# Patient Record
Sex: Male | Born: 1960 | Race: White | Hispanic: No | Smoking: Current every day smoker
Health system: Southern US, Community
[De-identification: ages and names within clinical notes are randomized; demographics above are authoritative.]

## PROBLEM LIST (undated history)

## (undated) DIAGNOSIS — F419 Anxiety disorder, unspecified: Secondary | ICD-10-CM

## (undated) DIAGNOSIS — I10 Essential (primary) hypertension: Secondary | ICD-10-CM

## (undated) DIAGNOSIS — G40909 Epilepsy, unspecified, not intractable, without status epilepticus: Secondary | ICD-10-CM

## (undated) DIAGNOSIS — I6529 Occlusion and stenosis of unspecified carotid artery: Secondary | ICD-10-CM

## (undated) DIAGNOSIS — F32A Depression, unspecified: Secondary | ICD-10-CM

## (undated) DIAGNOSIS — F1020 Alcohol dependence, uncomplicated: Secondary | ICD-10-CM

## (undated) DIAGNOSIS — D696 Thrombocytopenia, unspecified: Secondary | ICD-10-CM

## (undated) HISTORY — DX: Thrombocytopenia, unspecified: D69.6

## (undated) HISTORY — DX: Depression, unspecified: F32.A

## (undated) HISTORY — PX: FOOT FRACTURE SURGERY: SHX645

## (undated) HISTORY — DX: Anxiety disorder, unspecified: F41.9

## (undated) HISTORY — DX: Epilepsy, unspecified, not intractable, without status epilepticus: G40.909

## (undated) HISTORY — PX: HAND SURGERY: SHX662

## (undated) HISTORY — DX: Occlusion and stenosis of unspecified carotid artery: I65.29

---

## 2002-05-27 ENCOUNTER — Emergency Department (HOSPITAL_COMMUNITY): Admission: EM | Admit: 2002-05-27 | Discharge: 2002-05-27 | Payer: Self-pay | Admitting: Emergency Medicine

## 2004-08-15 ENCOUNTER — Emergency Department (HOSPITAL_COMMUNITY): Admission: EM | Admit: 2004-08-15 | Discharge: 2004-08-15 | Payer: Self-pay | Admitting: Emergency Medicine

## 2004-09-15 ENCOUNTER — Emergency Department (HOSPITAL_COMMUNITY): Admission: EM | Admit: 2004-09-15 | Discharge: 2004-09-15 | Payer: Self-pay

## 2004-12-06 ENCOUNTER — Emergency Department (HOSPITAL_COMMUNITY): Admission: EM | Admit: 2004-12-06 | Discharge: 2004-12-06 | Payer: Self-pay | Admitting: Emergency Medicine

## 2004-12-06 ENCOUNTER — Observation Stay (HOSPITAL_COMMUNITY): Admission: EM | Admit: 2004-12-06 | Discharge: 2004-12-07 | Payer: Self-pay | Admitting: Emergency Medicine

## 2004-12-13 ENCOUNTER — Ambulatory Visit: Payer: Self-pay | Admitting: Internal Medicine

## 2005-09-13 ENCOUNTER — Emergency Department (HOSPITAL_COMMUNITY): Admission: EM | Admit: 2005-09-13 | Discharge: 2005-09-13 | Payer: Self-pay | Admitting: Emergency Medicine

## 2007-01-18 ENCOUNTER — Inpatient Hospital Stay (HOSPITAL_COMMUNITY): Admission: EM | Admit: 2007-01-18 | Discharge: 2007-01-20 | Payer: Self-pay | Admitting: Emergency Medicine

## 2007-10-04 ENCOUNTER — Emergency Department (HOSPITAL_COMMUNITY): Admission: EM | Admit: 2007-10-04 | Discharge: 2007-10-04 | Payer: Self-pay | Admitting: Emergency Medicine

## 2007-10-07 ENCOUNTER — Ambulatory Visit: Payer: Self-pay | Admitting: Orthopedic Surgery

## 2007-11-02 ENCOUNTER — Ambulatory Visit: Payer: Self-pay | Admitting: Orthopedic Surgery

## 2007-11-02 DIAGNOSIS — S92009A Unspecified fracture of unspecified calcaneus, initial encounter for closed fracture: Secondary | ICD-10-CM

## 2007-11-04 ENCOUNTER — Emergency Department (HOSPITAL_COMMUNITY): Admission: EM | Admit: 2007-11-04 | Discharge: 2007-11-05 | Payer: Self-pay | Admitting: Emergency Medicine

## 2007-11-12 ENCOUNTER — Encounter: Payer: Self-pay | Admitting: Orthopedic Surgery

## 2007-11-17 ENCOUNTER — Encounter: Payer: Self-pay | Admitting: Orthopedic Surgery

## 2007-11-23 ENCOUNTER — Ambulatory Visit: Payer: Self-pay | Admitting: Orthopedic Surgery

## 2008-01-04 ENCOUNTER — Ambulatory Visit: Payer: Self-pay | Admitting: Orthopedic Surgery

## 2008-02-15 ENCOUNTER — Ambulatory Visit: Payer: Self-pay | Admitting: Orthopedic Surgery

## 2008-02-15 DIAGNOSIS — M7660 Achilles tendinitis, unspecified leg: Secondary | ICD-10-CM

## 2008-03-24 ENCOUNTER — Ambulatory Visit: Payer: Self-pay | Admitting: Orthopedic Surgery

## 2008-07-20 ENCOUNTER — Ambulatory Visit: Payer: Self-pay | Admitting: Orthopedic Surgery

## 2008-07-28 ENCOUNTER — Telehealth: Payer: Self-pay | Admitting: Orthopedic Surgery

## 2008-08-08 ENCOUNTER — Telehealth: Payer: Self-pay | Admitting: Orthopedic Surgery

## 2008-08-11 ENCOUNTER — Encounter: Payer: Self-pay | Admitting: Orthopedic Surgery

## 2009-06-02 IMAGING — CR DG FOOT COMPLETE 3+V*R*
3 series · 3 of 3 positions shown · non-contrast
Comparison: none available.

CLINICAL DATA: Broken right foot.  
 RIGHT FOOT - 3 VIEW:

[view not recorded (1 of 3)]
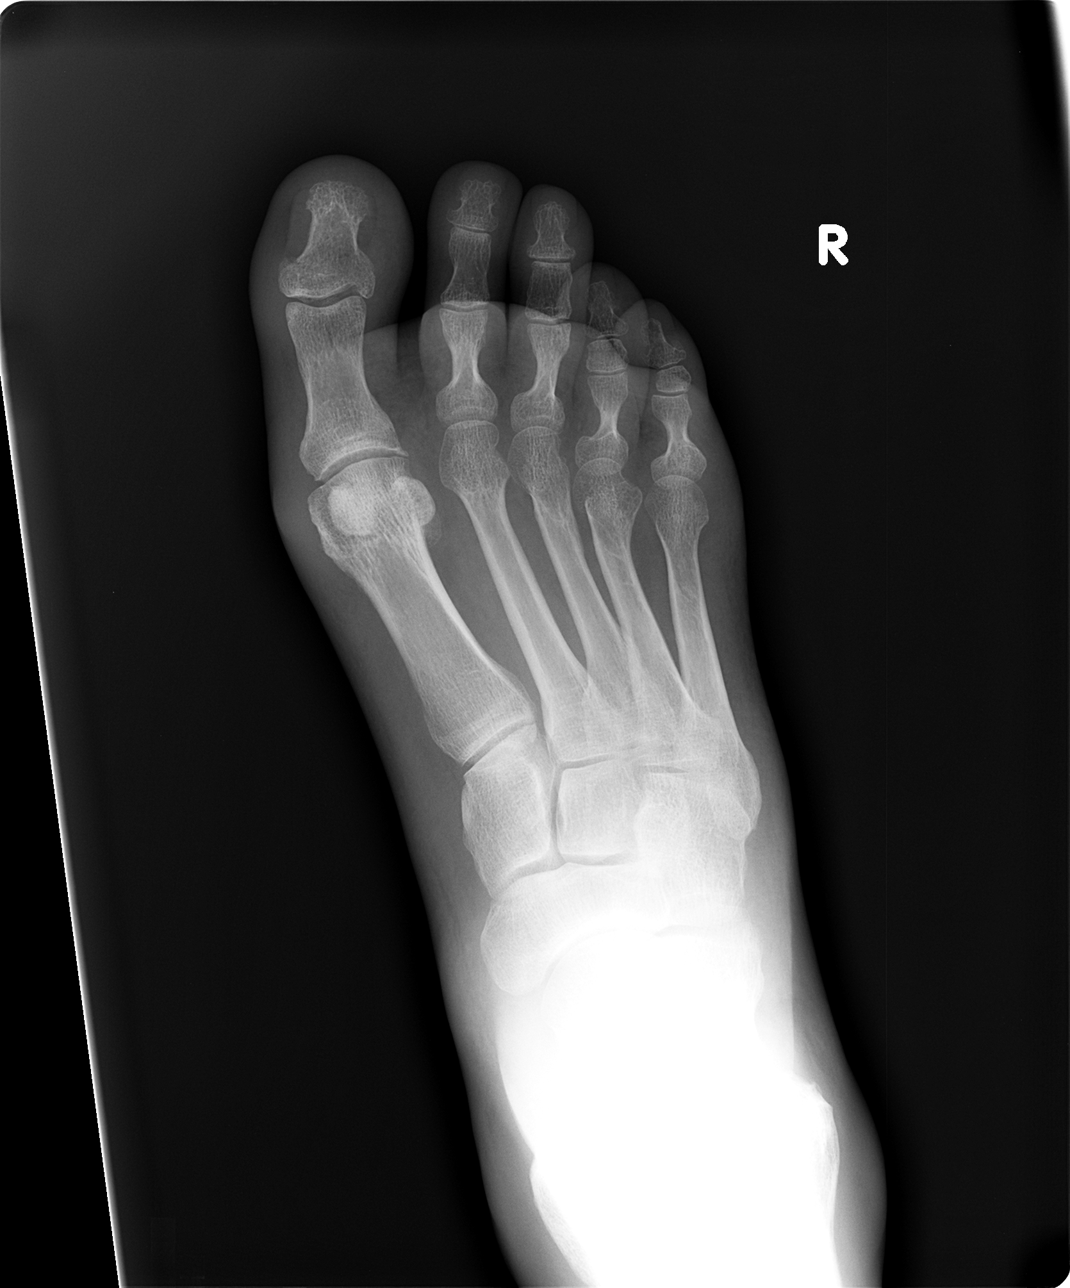

[view not recorded (2 of 3)]
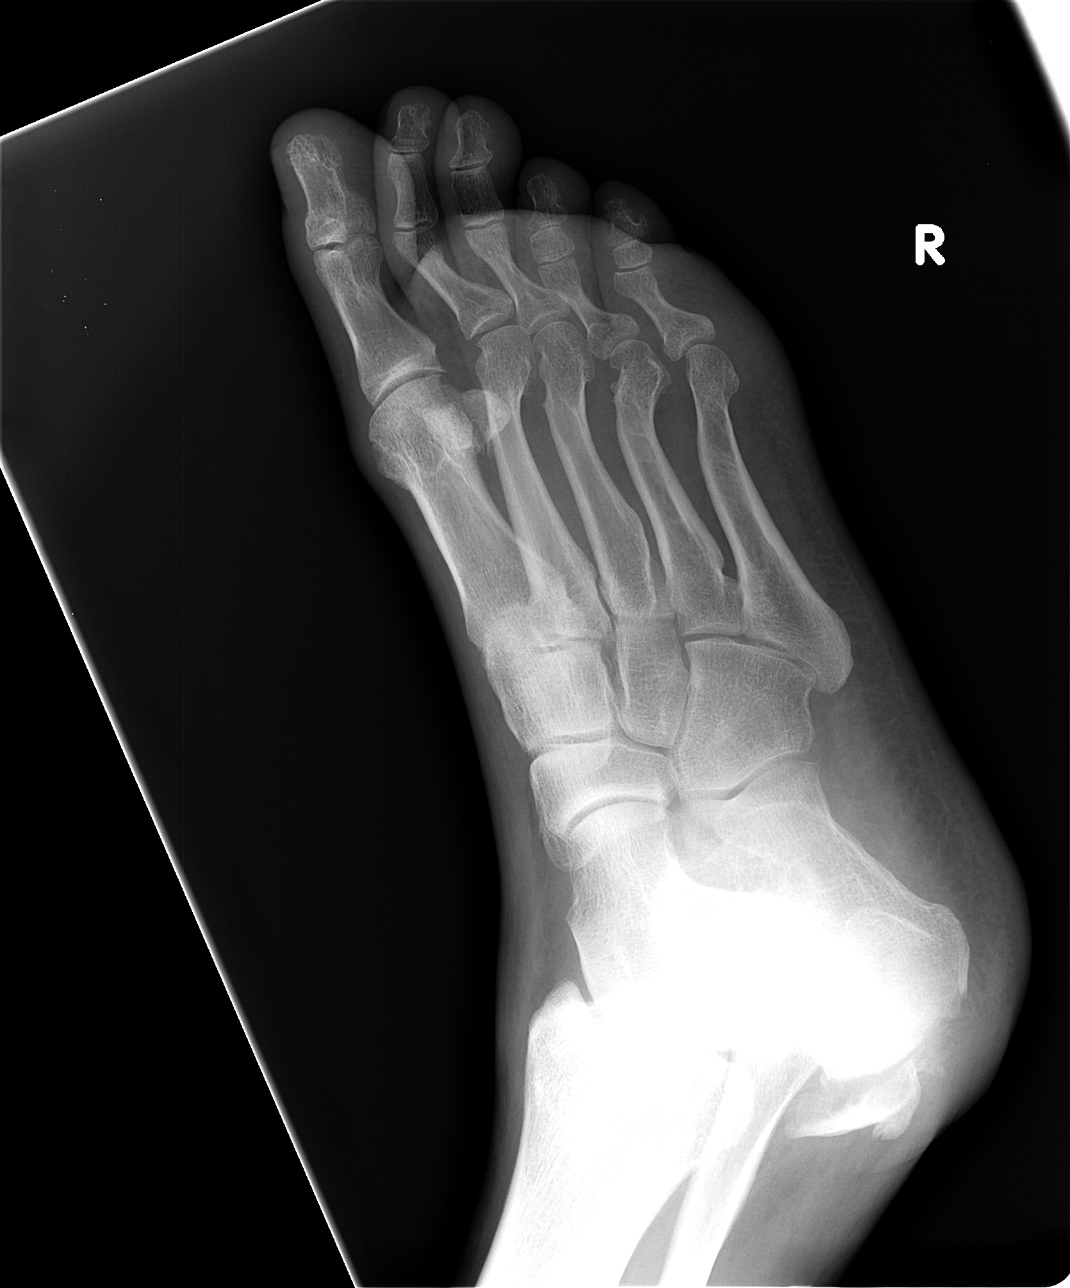

[view not recorded (3 of 3)]
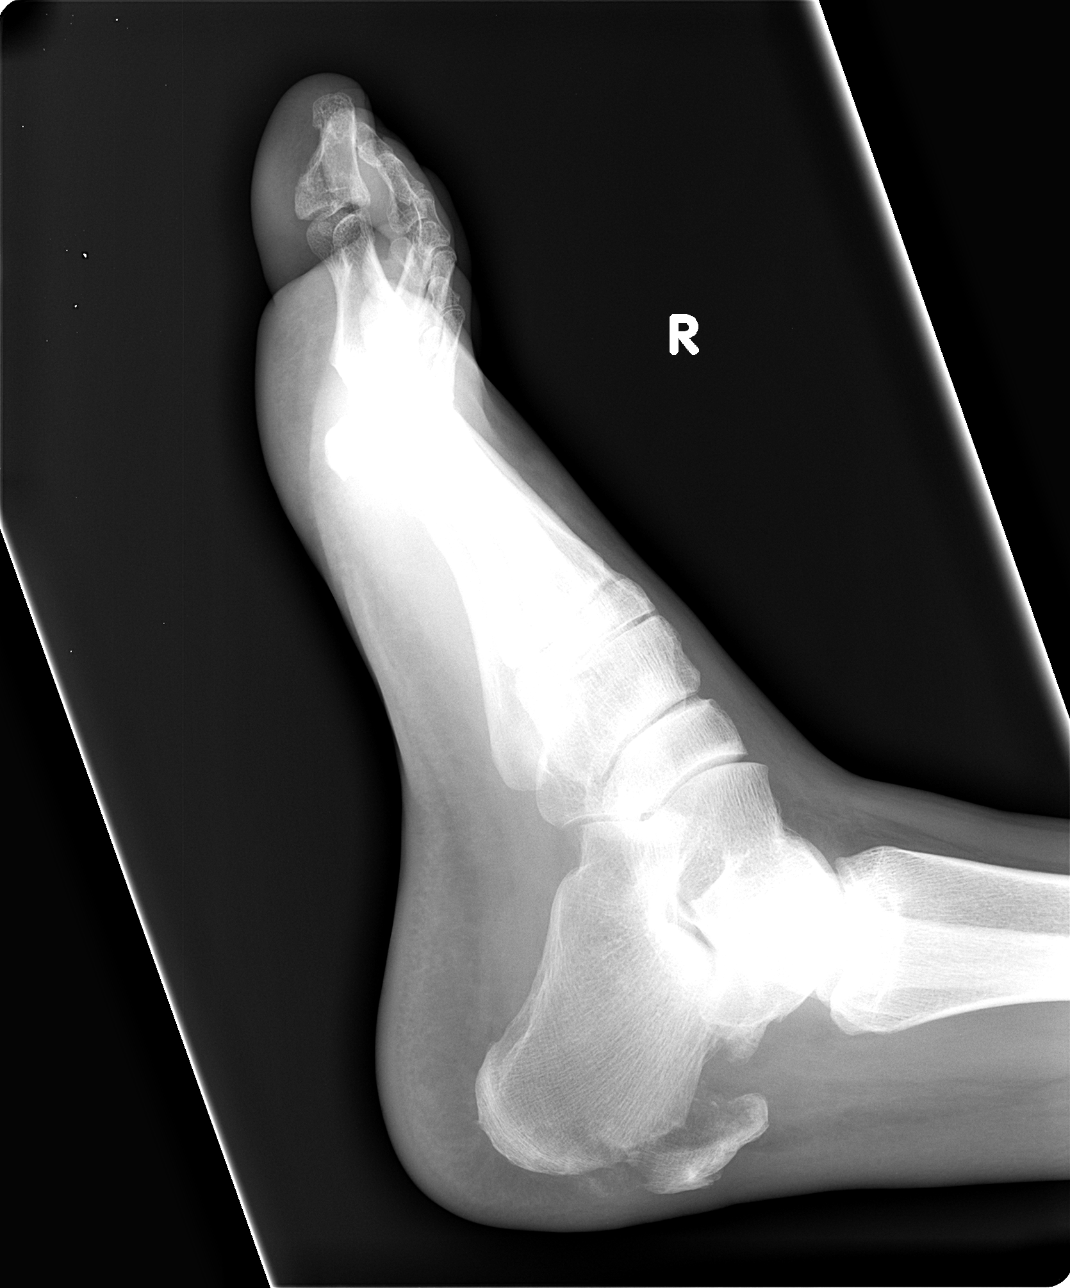

[3 of 3 positions shown; findings below may reference images not displayed]

FINDINGS: 1st MTP osteoarthritis is present, moderate.  Old deformity of the right 4th ray, at the head-neck junction of the metatarsal and proximal phalanx.   
 The calcaneus is abnormal, with a fracture of the calcaneal tuberosity, and proximal retraction.  Findings consistent with the so-called calcaneal insufficiency avulsion.
IMPRESSION: Avulsion fracture of calcaneal tuberosity, with 1.4 cm of proximal retraction.  
 RIGHT ANKLE - 3 VIEW:
FINDINGS: Again, the calcaneal tuberosity fracture is identified, with proximal retraction of 1.4 cm.  Diffuse soft tissue swelling is present around the ankle.  Diffuse edema in Kager?s fat pain.
IMPRESSION: Calcaneal tuberosity avulsion fracture.

## 2009-06-02 IMAGING — CR DG ANKLE COMPLETE 3+V*R*
3 series · 3 of 3 positions shown · non-contrast
Comparison: none available.

CLINICAL DATA: Broken right foot.  
 RIGHT FOOT - 3 VIEW:

[view not recorded (1 of 3)]
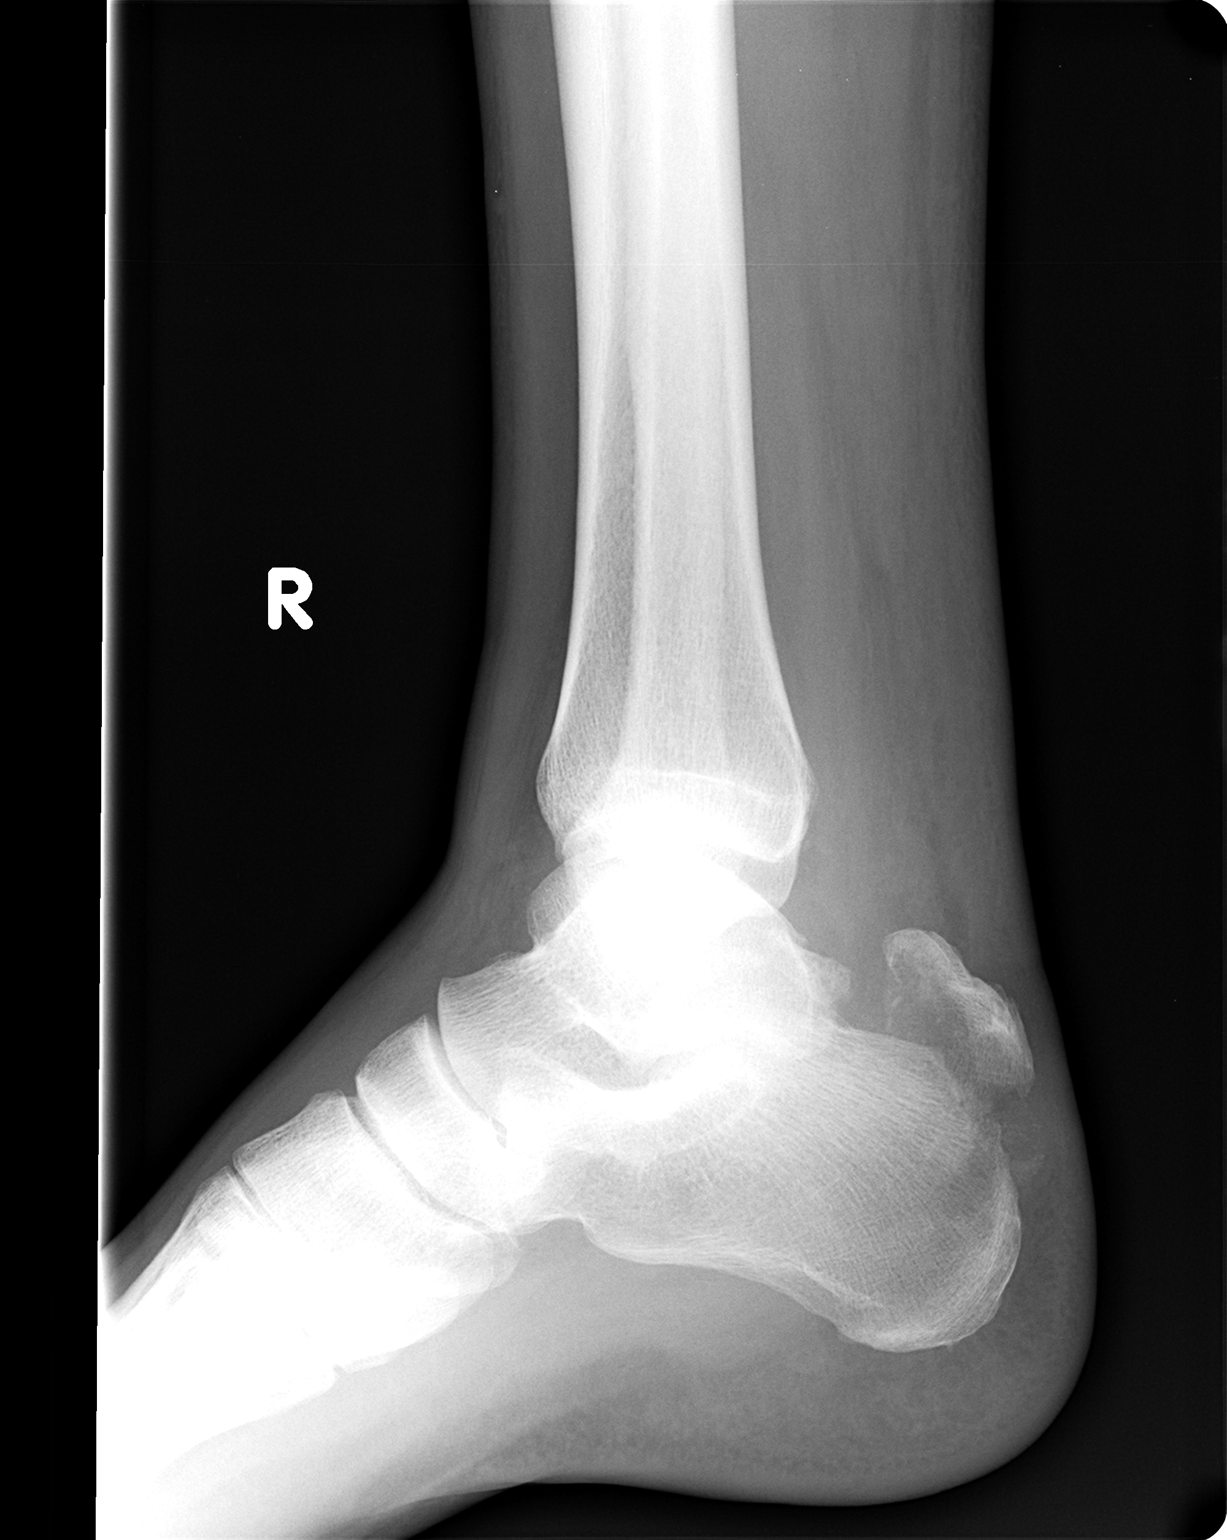

[view not recorded (2 of 3)]
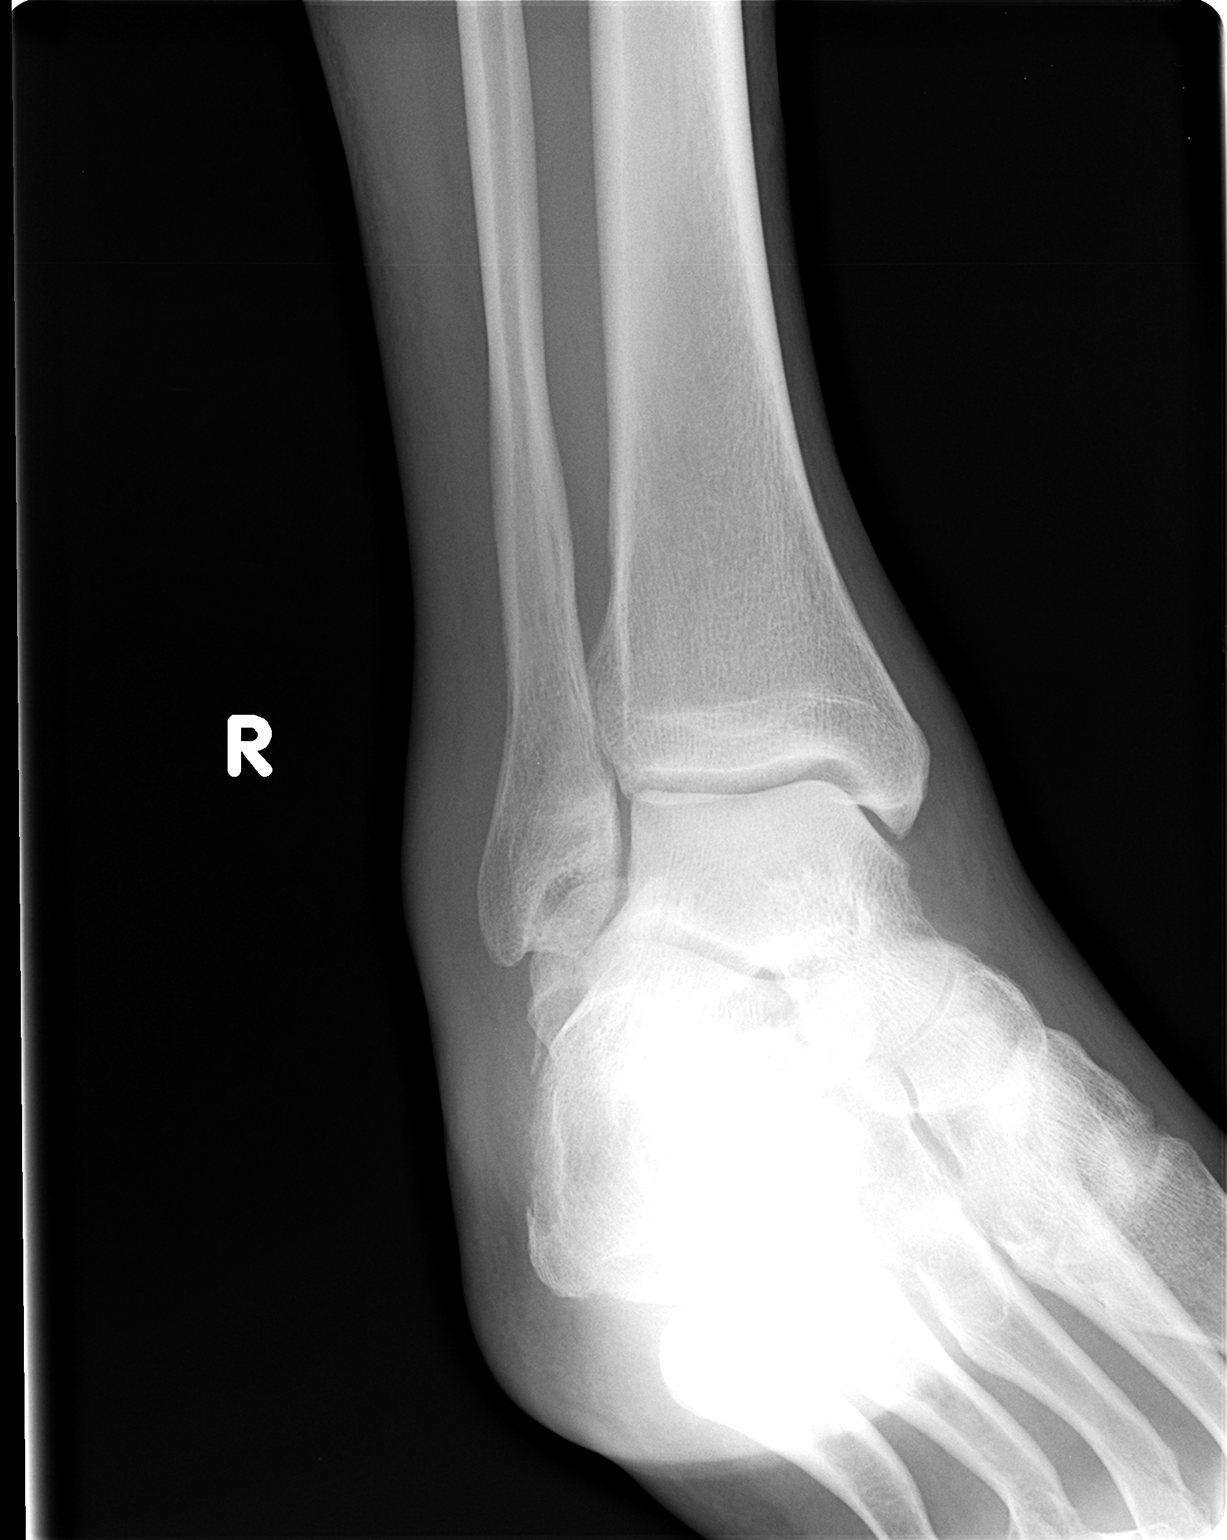

[view not recorded (3 of 3)]
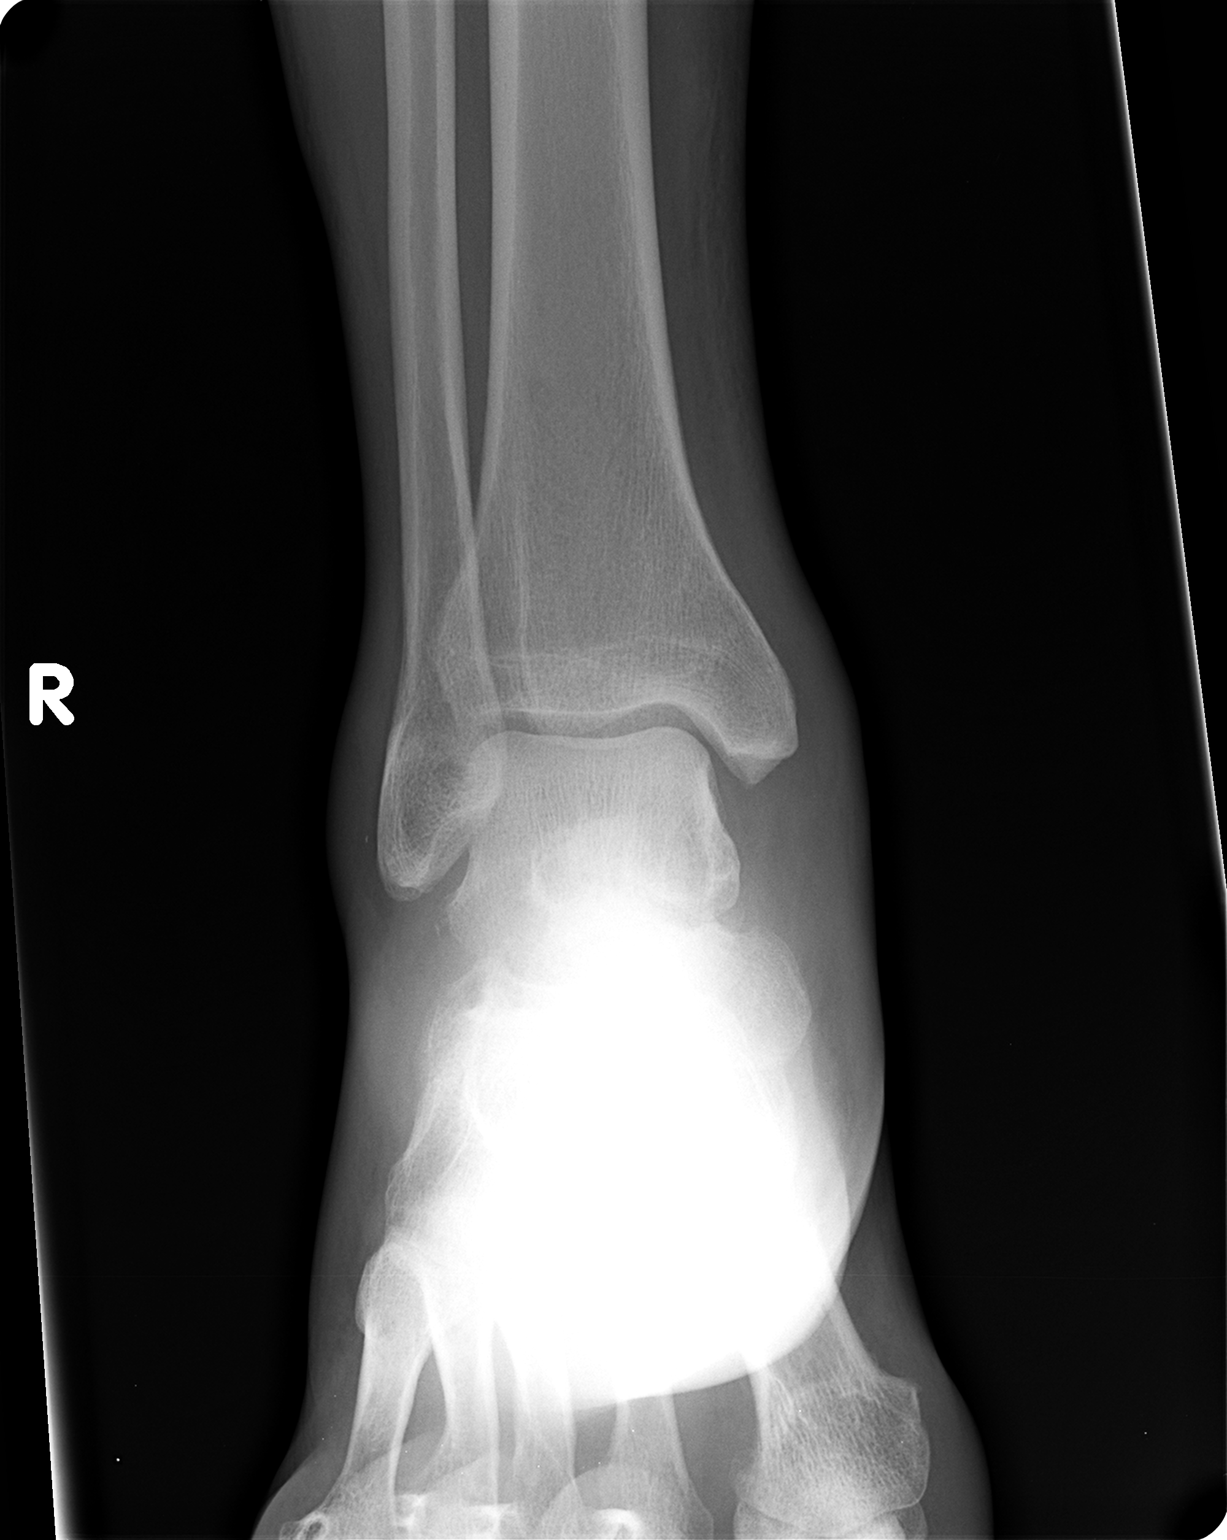

[3 of 3 positions shown; findings below may reference images not displayed]

FINDINGS: 1st MTP osteoarthritis is present, moderate.  Old deformity of the right 4th ray, at the head-neck junction of the metatarsal and proximal phalanx.   
 The calcaneus is abnormal, with a fracture of the calcaneal tuberosity, and proximal retraction.  Findings consistent with the so-called calcaneal insufficiency avulsion.
IMPRESSION: Avulsion fracture of calcaneal tuberosity, with 1.4 cm of proximal retraction.  
 RIGHT ANKLE - 3 VIEW:
FINDINGS: Again, the calcaneal tuberosity fracture is identified, with proximal retraction of 1.4 cm.  Diffuse soft tissue swelling is present around the ankle.  Diffuse edema in Kager?s fat pain.
IMPRESSION: Calcaneal tuberosity avulsion fracture.

## 2009-10-27 ENCOUNTER — Encounter: Payer: Self-pay | Admitting: Orthopedic Surgery

## 2011-01-29 NOTE — Letter (Signed)
Summary: Medical records request Disab Determination  Medical records request Disab Determination   Imported By: Cammie Sickle 02/07/2010 19:01:12  _____________________________________________________________________  External Attachment:    Type:   Image     Comment:   External Document

## 2011-05-17 NOTE — Discharge Summary (Signed)
NAME:  MASSIMO, HARTLAND NO.:  [id number]   MEDICAL RECORD NO.:  [id number]          PATIENT TYPE:  INP   LOCATION:  A219                          FACILITY:  [facility]   PHYSICIAN:  Kingsley Callander. Ouida Sills, MD       DATE OF BIRTH:  1961-11-22   DATE OF ADMISSION:  12/06/2004  DATE OF DISCHARGE:  12/09/2005LH                                 DISCHARGE SUMMARY   DISCHARGE DIAGNOSES:  1.  Right upper lobe pneumonia with hemoptysis.  2.  Hypokalemia.  3.  Hyponatremia.   HOSPITAL COURSE:  This patient is a 50 year old white male who presented  with cough and hemoptysis.  He experienced hemoptysis for approximately 13  hours. His hemoglobin remained stable at 14.6.  Chest x-ray revealed a right  hilar infiltrate.  His chest CT revealed a patchy opacity in the right upper  lobe.  These films were reviewed with Dr. Jean Rosenthal.  He was not febrile.  His  white count was 8.1.  He is a chronic smoker.  A PPD was placed.   At the time of my examination, his coughing and hemoptysis had resolved.  He  felt comfortable with hospital discharge.  His PPD will be read in two days.  He was provided with Avelox 400 mg daily samples for 9 more days.  He was  treated with IV Rocephin and Zithromax while hospitalized.  He developed  hypokalemia at 3.2 and was supplemented with oral potassium.   He will be seen in followup in the office in two weeks, and will have a  repeat chest x-ray.  Fortunately, there is no definite sign of malignancy at  this point, but clearly, he will need to be followed closely.   He was encouraged to stop smoking.   DISCHARGE MEDICATIONS:  Avelox 400 mg daily to complete a 10-day course of  antibiotics.     Channing Mutters   ROF/MEDQ  D:  12/07/2004  T:  12/08/2004  Job:  [id number]

## 2011-05-17 NOTE — Discharge Summary (Signed)
NAME:  Andrew Knapp, Andrew Knapp NO.:  [id number]   MEDICAL RECORD NO.:  [id number]          PATIENT TYPE:  INP   LOCATION:  A213                          FACILITY:  [facility]   PHYSICIAN:  Skeet Latch, DO    DATE OF BIRTH:  1961/07/22   DATE OF ADMISSION:  01/18/2007  DATE OF DISCHARGE:  01/22/2008LH                               DISCHARGE SUMMARY   PRIMARY CARE PHYSICIAN:  None.   DISCHARGE DIAGNOSES:  1. Alcohol withdrawal.  2. Elevated liver enzymes.  3. History of alcohol abuse.  4. Elevated blood pressure.  5. Tobacco abuse.  6. Thrombocytopenia.   HISTORY:  Andrew Knapp is a 50 year old Caucasian male who was recently  released from jail.  After spending approximately three days in jail, he  began to have incoherence, tremors and hallucinations.  The patient has  a history of longstanding alcohol abuse and drinks hard liquor and beer  daily.  His brother states that the patient became incoherent and was  having tremors and responded inappropriately to questions.   HOSPITAL COURSE:  The patient was seen and admitted through the  emergency room.  The patient was started on alcohol withdrawal  management protocol, Ativan IV.  He was placed on IV fluids and given  thiamine, multivitamin and folic acid.  The patient was also found to  have elevated liver enzymes.  A hepatitis profile was performed which  was negative.  I feel this is due to his alcohol abuse.  The patient  still has slight tremors, but they are greatly improved.  The patient is  coherent.  He is alert and oriented x3.   The patient is wanting to go home.  The patient will be counseled about  alcohol rehab.   DISCHARGE MEDICATIONS:  1. Ativan 1 mg p.o. t.i.d. p.r.n.  2. Thiamine 100 mg p.o. q.d.  3. Folic acid 1 mg p.o. q.d.  4. A multivitamin one tab p.o. q.d.   The patient does not have a primary care physician.  Names will be given  to the patient, to follow up in one to two weeks.  The patient  will also  be counseled about social work, regarding an alcohol rehab facility.  I  had a long discussion with the patient regarding alcohol abuse.  I  indicated to the patient that he is having liver damage, as long as he  continues to drink.  I stressed the importance of abstinence from any  type of alcoholic beverage whatsoever.      Skeet Latch, DO  Electronically Signed     SM/MEDQ  D:  01/20/2007  T:  01/20/2007  Job:  [id number]

## 2011-05-17 NOTE — H&P (Signed)
NAME:  Andrew Knapp, Andrew Knapp NO.:  [id number]   MEDICAL RECORD NO.:  [id number]          PATIENT TYPE:  INP   LOCATION:  A219                          FACILITY:  [facility]   PHYSICIAN:  Kingsley Callander. Ouida Sills, MD       DATE OF BIRTH:  [birth year]   DATE OF ADMISSION:  12/06/2004  DATE OF DISCHARGE:  LH                                HISTORY & PHYSICAL   CHIEF COMPLAINT:  Coughing up blood.   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  He started coughing up blood while smoking a cigarette at  approximately 3:00 p.m.  He was evaluated once in the emergency room,  discharged and then came back for persistent coughing up of blood, which  lasted until 4 o'clock in the morning.  He was hospitalized and started on  Rocephin and Zithromax.  His CAT scan revealed an abnormality in the right  upper lobe.  He remained hemodynamically stable and did not become anemic.  He has smoked since age 15.  He smokes two packs per day now and has done so  for the past two years.  He denies weight loss, fevers, chills or night  sweats.   PAST MEDICAL HISTORY:  1.  Dental abscess.  2.  Right arm infection.   MEDICATIONS:  None.   ALLERGIES:  None.   SOCIAL HISTORY:  He drinks alcohol regularly.  Smoking history as above.   FAMILY HISTORY:  Remarkable for thyroid disease in both parents.   REVIEW OF SYSTEMS:  No chest pain.  No chronic sputum production.  His  appetite has been fine.   PHYSICAL EXAMINATION:  VITAL SIGNS:  Temperature 98; blood pressure 162/99;  pulse 80; respirations 20; oxygen saturation 99% on room air.  GENERAL:  Alert and oriented.  HEENT:  Eyes, nose normal.  His pharynx reveals some missing teeth.  No JVD  or thyromegaly.  LUNGS:  Clear.  HEART:  Regular with no murmurs.  ABDOMEN:  Nontender.  No hepatosplenomegaly.  EXTREMITIES:  No cyanosis, clubbing or  edema.  NEUROLOGIC:  Fully intact.  LYMPH NODES:  No cervical or supraclavicular enlargement.   LABORATORY DATA:  White count 8.1, hemoglobin 14.6, platelets 197.  INR 0.8.  Sodium 128, potassium 3.2, bicarb 24, glucose 103, BUN 9, creatinine 1.0,  SGOT 23, calcium 8.4, albumin 3.6.   IMPRESSION:  Hemoptysis.  His chest x-ray and CT scan will be reviewed with  the radiologist.  He evidently has a right upper lobe abnormality based on  ER notes.  This is certainly worrisome for a lung cancer.  He has been  started on Rocephin and Zithromax IV.  Cough suppression has been achieved  with Hycodan.     Channing Mutters   ROF/MEDQ  D:  12/07/2004  T:  12/07/2004  Job:  [id number]

## 2011-05-17 NOTE — H&P (Signed)
NAME:  TAVARIOUS, FREEL                  ACCOUNT NO.:  [id number]   MEDICAL RECORD NO.:  [id number]          PATIENT TYPE:  INP   LOCATION:  A213                          FACILITY:  [facility]   PHYSICIAN:  Mobolaji B. Bakare, M.D.DATE OF BIRTH:  1961/02/25   DATE OF ADMISSION:  01/18/2007  DATE OF DISCHARGE:  LH                              HISTORY & PHYSICAL   PRIMARY CARE PHYSICIAN:  Unassigned.   CHIEF COMPLAINT:  Tremors and hallucinations.   HISTORY OF PRESENTING COMPLAINT:  Mr. Morrical is a 50 year old Caucasian  male who was released from jail this morning after spending about 3 days  in jail.  He was brought by his brother because of incoherence,  tremulousness and hallucinations.  The patient drinks heavily, according  to his brother.  He drinks hard liquor.  His brother is unable to  quantify how much the patient drinks, but has stated that he drinks  heavily.  The patient is unable to give a history.  He is incoherent and  tremulous, fidgety and not responding adequately to questions.   REVIEW OF SYSTEMS:  Unobtainable.   PAST MEDICAL HISTORY:  1. History of hypertension, but the patient is not on any medications.  2. Right upper lobe pneumonia in 2005.   PAST SURGICAL HISTORY:  Hand surgery.   MEDICATIONS:  None.   ALLERGIES:  Allergic to SHELLFISH.   FAMILY HISTORY:  Significant for diabetes in the patient's mom.  The  patient's father passed away.  He was alcoholic.   SOCIAL HISTORY:  The patient works in a Systems analyst.  He drinks alcohol  heavily and smokes cigarettes regularly.   PHYSICAL EXAMINATION:  INITIAL VITAL SIGNS:  Temperature 98.4, blood  pressure 150/76, pulse 92, respiratory rate 16, O2 saturations of 99% on  room air.  GENERAL:  The patient is awake, alert and is oriented.  He is not pale  or anicteric.  Fidgeting and tremulous at rest.  Not in respiratory  distress.  LUNGS:  Clear clinically to auscultation.  CARDIOVASCULAR:  S1 and S2, regular.  No  murmur, no gallop.  ABDOMEN:  Nondistended, nontender.  Bowel sounds present.  EXTREMITIES:  No pedal edema or calf tenderness.  Dorsalis pedis pulses  palpable bilaterally.  CNS:  No focal weakness.   INITIAL LABORATORY DATA:  Urine drug positive for benzodiazepines.  Otherwise, unremarkable.  Alcohol level less than 5.  Magnesium 2.1.  White cells 8.1.  Hemoglobin 15, hematocrit 43.6, platelets 88.  Normal  differential.  Total bilirubin 1.4, indirect 1.2.  AST 135, ALT 141,  total protein 7.7, albumin 4.5.  Sodium 131, potassium 4.0, chloride 96,  CO2 of 24, glucose 115, BUN 25, calcium 9.9.   ASSESSMENT AND PLAN:  Mr. Poynter is a 50 year old Caucasian male  presenting with tremulousness and incoherence.  He is a heavy drinker  who was incarcerated in jail for about 3 days.  He is not in alcohol  withdrawal.  He will be admitted to the telemetry floor and started on  alcohol withdrawal protocol with Ativan.   ADMISSION DIAGNOSES:  1.  Alcohol withdrawal.  Will start Ativan protocol, IV fluid, D-5      normal saline at 150 cc an hour, thiamine 100 mg IV/p.o. daily,      multivitamin one p.o. daily, and folic acid 1 mg p.o. daily.  2. Elevated liver enzymes currently secondary to alcohol; however, is      ALT is more than AST.  Will check a hepatitis profile to rule out      chronic hepatitis.  3. Alcohol dependence.  The patient will need rehabilitation upon      discharge.  4. History of hypertension.  Brother stated that the patient is not on      medication.  Will monitor blood pressure which is high at this      point, most likely related to alcohol withdrawal.  Should he have a      persistently elevated blood pressure, we will consider introducing      beta blocker.  5. Tobacco abuse.  Will place on nicotine patch and offer tobacco      cessation counseling.  6. Thrombocytopenia likely due to chronic alcohol abuse, there is no      active bleeding.      Mobolaji B.  Corky Downs, M.D.  Electronically Signed     MBB/MEDQ  D:  01/18/2007  T:  01/18/2007  Job:  [id number]

## 2011-10-08 LAB — DIFFERENTIAL
Basophils Absolute: 0.1
Basophils Relative: 1
Eosinophils Absolute: 0.8 — ABNORMAL HIGH
Eosinophils Relative: 8 — ABNORMAL HIGH
Lymphocytes Relative: 35
Lymphs Abs: 3.4 — ABNORMAL HIGH
Monocytes Absolute: 0.6
Monocytes Relative: 6
Neutro Abs: 4.8
Neutrophils Relative %: 49

## 2011-10-08 LAB — COMPREHENSIVE METABOLIC PANEL
ALT: 11
AST: 15
Albumin: 3.7
Alkaline Phosphatase: 87
BUN: 5 — ABNORMAL LOW
CO2: 28
Calcium: 9.2
Chloride: 102
Creatinine, Ser: 0.92
GFR calc Af Amer: >60
GFR calc non Af Amer: >60
Glucose, Bld: 119 — ABNORMAL HIGH
Potassium: 3.6
Sodium: 136
Total Bilirubin: 1
Total Protein: 6.7

## 2011-10-08 LAB — PROTIME-INR
INR: 1
Prothrombin Time: 13.5

## 2011-10-08 LAB — CBC
HCT: 46.4
Hemoglobin: 16
MCHC: 34.5
MCV: 93.8
Platelets: 185
RBC: 4.95
RDW: 13.1
WBC: 9.7

## 2011-10-08 LAB — ETHANOL: Alcohol, Ethyl (B): <5

## 2016-04-11 ENCOUNTER — Other Ambulatory Visit (HOSPITAL_COMMUNITY): Payer: Self-pay | Admitting: Internal Medicine

## 2016-04-11 ENCOUNTER — Ambulatory Visit (HOSPITAL_COMMUNITY)
Admission: RE | Admit: 2016-04-11 | Discharge: 2016-04-11 | Disposition: A | Discharge status: Home / Self Care | Payer: Self-pay | Source: Ambulatory Visit | Attending: Internal Medicine | Admitting: Internal Medicine

## 2016-04-11 DIAGNOSIS — M7732 Calcaneal spur, left foot: Secondary | ICD-10-CM | POA: Insufficient documentation

## 2016-04-11 DIAGNOSIS — M79672 Pain in left foot: Secondary | ICD-10-CM | POA: Insufficient documentation

## 2018-06-17 ENCOUNTER — Other Ambulatory Visit: Payer: Self-pay

## 2018-06-17 ENCOUNTER — Encounter (HOSPITAL_COMMUNITY): Payer: Self-pay | Admitting: Emergency Medicine

## 2018-06-17 ENCOUNTER — Emergency Department (HOSPITAL_COMMUNITY)
Admission: EM | Admit: 2018-06-17 | Discharge: 2018-06-17 | Disposition: A | Discharge status: Home / Self Care | Payer: Self-pay | Attending: Emergency Medicine | Admitting: Emergency Medicine

## 2018-06-17 ENCOUNTER — Emergency Department (HOSPITAL_COMMUNITY): Payer: Self-pay

## 2018-06-17 DIAGNOSIS — Z79899 Other long term (current) drug therapy: Secondary | ICD-10-CM | POA: Insufficient documentation

## 2018-06-17 DIAGNOSIS — I10 Essential (primary) hypertension: Secondary | ICD-10-CM | POA: Insufficient documentation

## 2018-06-17 DIAGNOSIS — F17210 Nicotine dependence, cigarettes, uncomplicated: Secondary | ICD-10-CM | POA: Insufficient documentation

## 2018-06-17 DIAGNOSIS — R197 Diarrhea, unspecified: Secondary | ICD-10-CM | POA: Insufficient documentation

## 2018-06-17 HISTORY — DX: Essential (primary) hypertension: I10

## 2018-06-17 LAB — COMPREHENSIVE METABOLIC PANEL WITH GFR
ALT: 34 U/L (ref 17–63)
AST: 43 U/L — ABNORMAL HIGH (ref 15–41)
Albumin: 4.2 g/dL (ref 3.5–5.0)
Alkaline Phosphatase: 99 U/L (ref 38–126)
Anion gap: 12 (ref 5–15)
BUN: 12 mg/dL (ref 6–20)
CO2: 27 mmol/L (ref 22–32)
Calcium: 9.3 mg/dL (ref 8.9–10.3)
Chloride: 93 mmol/L — ABNORMAL LOW (ref 101–111)
Creatinine, Ser: 1.01 mg/dL (ref 0.61–1.24)
GFR calc Af Amer: >60 mL/min
GFR calc non Af Amer: >60 mL/min
Glucose, Bld: 145 mg/dL — ABNORMAL HIGH (ref 65–99)
Potassium: 3.7 mmol/L (ref 3.5–5.1)
Sodium: 132 mmol/L — ABNORMAL LOW (ref 135–145)
Total Bilirubin: 1.4 mg/dL — ABNORMAL HIGH (ref 0.3–1.2)
Total Protein: 7.9 g/dL (ref 6.5–8.1)

## 2018-06-17 LAB — URINALYSIS, ROUTINE W REFLEX MICROSCOPIC
Glucose, UA: NEGATIVE mg/dL
Hgb urine dipstick: NEGATIVE
Ketones, ur: 20 mg/dL — AB
Leukocytes, UA: NEGATIVE
Nitrite: NEGATIVE
Protein, ur: >=300 mg/dL — AB
Specific Gravity, Urine: 1.023 (ref 1.005–1.030)
pH: 6 (ref 5.0–8.0)

## 2018-06-17 LAB — DIFFERENTIAL
Basophils Absolute: 0 10*3/uL (ref 0.0–0.1)
Basophils Relative: 0 %
Eosinophils Absolute: 0.3 10*3/uL (ref 0.0–0.7)
Eosinophils Relative: 4 %
Lymphocytes Relative: 16 %
Lymphs Abs: 1.3 10*3/uL (ref 0.7–4.0)
Monocytes Absolute: 0.6 10*3/uL (ref 0.1–1.0)
Monocytes Relative: 7 %
Neutro Abs: 5.7 10*3/uL (ref 1.7–7.7)
Neutrophils Relative %: 73 %

## 2018-06-17 LAB — CBC
HCT: 47.7 % (ref 39.0–52.0)
Hemoglobin: 17 g/dL (ref 13.0–17.0)
MCH: 36 pg — ABNORMAL HIGH (ref 26.0–34.0)
MCHC: 35.6 g/dL (ref 30.0–36.0)
MCV: 101.1 fL — ABNORMAL HIGH (ref 78.0–100.0)
Platelets: 96 10*3/uL — ABNORMAL LOW (ref 150–400)
RBC: 4.72 MIL/uL (ref 4.22–5.81)
RDW: 14.7 % (ref 11.5–15.5)
WBC: 7.9 10*3/uL (ref 4.0–10.5)

## 2018-06-17 LAB — LIPASE, BLOOD: Lipase: 42 U/L (ref 11–51)

## 2018-06-17 MED ORDER — IBUPROFEN 800 MG PO TABS: 800.0000 mg | ORAL_TABLET | Freq: Three times a day (TID) | ORAL | 0 refills | Status: DC | PRN

## 2018-06-17 MED ORDER — ONDANSETRON 4 MG PO TBDP: ORAL_TABLET | ORAL | 0 refills | Status: DC

## 2018-06-17 MED ORDER — SODIUM CHLORIDE 0.9 % IV BOLUS
1000.0000 mL | Freq: Once | INTRAVENOUS | Status: AC
  Administered 2018-06-17: 1000 mL via INTRAVENOUS

## 2018-06-17 MED ORDER — KETOROLAC TROMETHAMINE 30 MG/ML IJ SOLN
30.0000 mg | Freq: Once | INTRAMUSCULAR | Status: AC
  Administered 2018-06-17: 30 mg via INTRAVENOUS
  Filled 2018-06-17: qty 1

## 2018-06-17 MED ORDER — ONDANSETRON HCL 4 MG/2ML IJ SOLN
4.0000 mg | Freq: Once | INTRAMUSCULAR | Status: AC
Start: 2018-06-17 — End: 2018-06-17
  Administered 2018-06-17: 4 mg via INTRAVENOUS
  Filled 2018-06-17: qty 2

## 2018-06-17 MED ORDER — FAMOTIDINE IN NACL 20-0.9 MG/50ML-% IV SOLN
20.0000 mg | Freq: Once | INTRAVENOUS | Status: AC
  Administered 2018-06-17: 20 mg via INTRAVENOUS
  Filled 2018-06-17: qty 50

## 2018-06-17 NOTE — ED Provider Notes (Signed)
[facility] EMERGENCY DEPARTMENT Provider Note   CSN: [id number] Arrival date & time: 06/17/18  0731     History   Chief Complaint Chief Complaint  Patient presents with  . Abdominal Pain    HPI Andrew Knapp is a 57 y.o. male.  Patient complains of abdominal discomfort with some nausea and vomiting.  This been going on for a few days.  No blood in his vomitus or his diarrhea  The history is provided by the patient. No language interpreter was used.  Abdominal Pain   This is a new problem. The current episode started 2 days ago. The problem occurs constantly. The problem has not changed since onset.The pain is associated with an unknown factor. The pain is located in the generalized abdominal region. The quality of the pain is aching. The pain is at a severity of 4/10. The pain is moderate. Associated symptoms include diarrhea and vomiting. Pertinent negatives include frequency, hematuria and headaches.    Past Medical History:  Diagnosis Date  . Hypertension     Patient Active Problem List   Diagnosis Date Noted  . ACHILLES TENDINITIS 02/15/2008  . CALCANEAL FRACTURE, RIGHT 11/02/2007    Past Surgical History:  Procedure Laterality Date  . HAND SURGERY          Home Medications    Prior to Admission medications   Medication Sig Start Date End Date Taking? Authorizing Provider  ALPRAZolam Duanne Moron) 1 MG tablet Take 1 mg by mouth 3 (three) times daily as needed for anxiety.   Yes [provider]  amLODipine (NORVASC) 5 MG tablet Take 5 mg by mouth daily.   Yes [provider]  PARoxetine (PAXIL) 30 MG tablet Take 30 mg by mouth daily.   Yes [provider]  polyvinyl alcohol (LIQUIFILM TEARS) 1.4 % ophthalmic solution Place 1 drop into both eyes as needed for dry eyes.   Yes [provider]  ibuprofen (ADVIL,MOTRIN) 800 MG tablet Take 1 tablet (800 mg total) by mouth every 8 (eight) hours as needed for moderate pain. 06/17/18    Milton Ferguson, MD  ondansetron (ZOFRAN ODT) 4 MG disintegrating tablet 4mg  ODT q4 hours prn nausea/vomit 06/17/18   Milton Ferguson, MD    Family History No family history on file.  Social History Social History   Tobacco Use  . Smoking status: Current Every Day Smoker    Packs/day: 0.50    Types: Cigarettes  . Smokeless tobacco: Never Used  Substance Use Topics  . Alcohol use: Not Currently  . Drug use: Never     Allergies   Patient has no known allergies.   Review of Systems Review of Systems  Constitutional: Negative for appetite change and fatigue.  HENT: Negative for congestion, ear discharge and sinus pressure.   Eyes: Negative for discharge.  Respiratory: Negative for cough.   Cardiovascular: Negative for chest pain.  Gastrointestinal: Positive for abdominal pain, diarrhea and vomiting.  Genitourinary: Negative for frequency and hematuria.  Musculoskeletal: Negative for back pain.  Skin: Negative for rash.  Neurological: Negative for seizures and headaches.  Psychiatric/Behavioral: Negative for hallucinations.     Physical Exam Updated Vital Signs BP (!) 201/90   Pulse 79   Temp 98 F (36.7 C) (Oral)   Resp 20   Ht 6\' 2"  (1.88 m)   Wt 99.8 kg (220 lb)   SpO2 100%   BMI 28.25 kg/m   Physical Exam  Constitutional: He is oriented to person, place, and time.  He appears well-developed.  HENT:  Head: Normocephalic.  Eyes: Conjunctivae and EOM are normal. No scleral icterus.  Neck: Neck supple. No thyromegaly present.  Cardiovascular: Normal rate and regular rhythm. Exam reveals no gallop and no friction rub.  No murmur heard. Pulmonary/Chest: No stridor. He has no wheezes. He has no rales. He exhibits no tenderness.  Abdominal: He exhibits no distension. There is tenderness. There is no rebound.  Musculoskeletal: Normal range of motion. He exhibits no edema.  Lymphadenopathy:    He has no cervical adenopathy.  Neurological: He is oriented to person,  place, and time. He exhibits normal muscle tone. Coordination normal.  Skin: No rash noted. No erythema.  Psychiatric: He has a normal mood and affect. His behavior is normal.     ED Treatments / Results  Labs (all labs ordered are listed, but only abnormal results are displayed) Labs Reviewed  COMPREHENSIVE METABOLIC PANEL - Abnormal; Notable for the following components:      Result Value   Sodium 132 (*)    Chloride 93 (*)    Glucose, Bld 145 (*)    AST 43 (*)    Total Bilirubin 1.4 (*)    All other components within normal limits  CBC - Abnormal; Notable for the following components:   MCV 101.1 (*)    MCH 36.0 (*)    Platelets 96 (*)    All other components within normal limits  URINALYSIS, ROUTINE W REFLEX MICROSCOPIC - Abnormal; Notable for the following components:   Color, Urine AMBER (*)    APPearance HAZY (*)    Bilirubin Urine SMALL (*)    Ketones, ur 20 (*)    Protein, ur >=300 (*)    Bacteria, UA RARE (*)    All other components within normal limits  LIPASE, BLOOD  DIFFERENTIAL    EKG None  Radiology Dg Abd Acute W/chest  Result Date: 06/17/2018 CLINICAL DATA:  Left chest and abdominal pain, vomiting. EXAM: DG ABDOMEN ACUTE W/ 1V CHEST COMPARISON:  11/04/2007 chest x-ray FINDINGS: The bowel gas pattern is normal. There is no evidence of free intraperitoneal air. No suspicious radio-opaque calculi or other significant radiographic abnormality is seen. Heart size and mediastinal contours are within normal limits. Both lungs are clear. Prostate calcifications noted.  No acute bony abnormality. IMPRESSION: No acute findings. No active cardiopulmonary disease. Electronically Signed   By: Rolm Baptise M.D.   On: 06/17/2018 09:09    Procedures Procedures (including critical care time)  Medications Ordered in ED Medications  sodium chloride 0.9 % bolus 1,000 mL (1,000 mLs Intravenous New Bag/Given 06/17/18 0824)  famotidine (PEPCID) IVPB 20 mg premix (20 mg  Intravenous New Bag/Given 06/17/18 0824)  ondansetron (ZOFRAN) injection 4 mg (4 mg Intravenous Given 06/17/18 0824)  ketorolac (TORADOL) 30 MG/ML injection 30 mg (30 mg Intravenous Given 06/17/18 0824)     Initial Impression / Assessment and Plan / ED Course  I have reviewed the triage vital signs and the nursing notes.  Pertinent labs & imaging results that were available during my care of the patient were reviewed by me and considered in my medical decision making (see chart for details).     Labs unremarkable chest x-ray abdominal series unremarkable.  Patient improved with fluids nausea medicine.  Suspect viral syndrome patient will be sent home with Zofran and Motrin and will follow-up with his PCP  Final Clinical Impressions(s) / ED Diagnoses   Final diagnoses:  Diarrhea, unspecified type    ED  Discharge Orders        Ordered    ondansetron (ZOFRAN ODT) 4 MG disintegrating tablet     06/17/18 0952    ibuprofen (ADVIL,MOTRIN) 800 MG tablet  Every 8 hours PRN     06/17/18 1540       Milton Ferguson, MD 06/17/18 [phone]

## 2018-06-17 NOTE — Discharge Instructions (Addendum)
Drink plenty of fluids.  Follow-up with your family doctor next week for recheck 

## 2018-06-17 NOTE — ED Triage Notes (Signed)
Pt c/o abdominal pain with n/v/d since Monday evening. Pt vomited once this morning.

## 2018-08-03 ENCOUNTER — Emergency Department (HOSPITAL_COMMUNITY): Payer: Self-pay

## 2018-08-03 ENCOUNTER — Other Ambulatory Visit: Payer: Self-pay

## 2018-08-03 ENCOUNTER — Encounter (HOSPITAL_COMMUNITY): Payer: Self-pay | Admitting: Emergency Medicine

## 2018-08-03 ENCOUNTER — Emergency Department (HOSPITAL_COMMUNITY)
Admission: EM | Admit: 2018-08-03 | Discharge: 2018-08-03 | Disposition: A | Discharge status: Home / Self Care | Payer: Self-pay | Attending: Emergency Medicine | Admitting: Emergency Medicine

## 2018-08-03 ENCOUNTER — Encounter (HOSPITAL_COMMUNITY): Payer: Self-pay

## 2018-08-03 ENCOUNTER — Inpatient Hospital Stay (HOSPITAL_COMMUNITY)
Admission: EM | Admit: 2018-08-03 | Discharge: 2018-08-05 | DRG: 101 | Disposition: A | Discharge status: Home / Self Care | Payer: Self-pay | Attending: Family Medicine | Admitting: Family Medicine

## 2018-08-03 DIAGNOSIS — I10 Essential (primary) hypertension: Secondary | ICD-10-CM | POA: Diagnosis present

## 2018-08-03 DIAGNOSIS — R7309 Other abnormal glucose: Secondary | ICD-10-CM | POA: Insufficient documentation

## 2018-08-03 DIAGNOSIS — E039 Hypothyroidism, unspecified: Secondary | ICD-10-CM | POA: Diagnosis present

## 2018-08-03 DIAGNOSIS — Z79899 Other long term (current) drug therapy: Secondary | ICD-10-CM | POA: Insufficient documentation

## 2018-08-03 DIAGNOSIS — F17210 Nicotine dependence, cigarettes, uncomplicated: Secondary | ICD-10-CM | POA: Insufficient documentation

## 2018-08-03 DIAGNOSIS — E871 Hypo-osmolality and hyponatremia: Secondary | ICD-10-CM | POA: Insufficient documentation

## 2018-08-03 DIAGNOSIS — F1010 Alcohol abuse, uncomplicated: Secondary | ICD-10-CM | POA: Diagnosis present

## 2018-08-03 DIAGNOSIS — R569 Unspecified convulsions: Secondary | ICD-10-CM | POA: Insufficient documentation

## 2018-08-03 DIAGNOSIS — F10939 Alcohol use, unspecified with withdrawal, unspecified: Secondary | ICD-10-CM

## 2018-08-03 DIAGNOSIS — D72829 Elevated white blood cell count, unspecified: Secondary | ICD-10-CM | POA: Diagnosis present

## 2018-08-03 DIAGNOSIS — G4089 Other seizures: Principal | ICD-10-CM | POA: Diagnosis present

## 2018-08-03 DIAGNOSIS — R17 Unspecified jaundice: Secondary | ICD-10-CM | POA: Insufficient documentation

## 2018-08-03 DIAGNOSIS — E860 Dehydration: Secondary | ICD-10-CM | POA: Diagnosis present

## 2018-08-03 DIAGNOSIS — F10239 Alcohol dependence with withdrawal, unspecified: Secondary | ICD-10-CM | POA: Diagnosis present

## 2018-08-03 DIAGNOSIS — G40409 Other generalized epilepsy and epileptic syndromes, not intractable, without status epilepticus: Secondary | ICD-10-CM | POA: Diagnosis present

## 2018-08-03 HISTORY — DX: Alcohol dependence, uncomplicated: F10.20

## 2018-08-03 LAB — URINALYSIS, ROUTINE W REFLEX MICROSCOPIC
BACTERIA UA: NONE SEEN
BILIRUBIN URINE: NEGATIVE
BILIRUBIN URINE: NEGATIVE
Glucose, UA: 150 mg/dL — AB
Glucose, UA: NEGATIVE mg/dL
KETONES UR: 5 mg/dL — AB
Ketones, ur: 80 mg/dL — AB
LEUKOCYTES UA: NEGATIVE
LEUKOCYTES UA: NEGATIVE
NITRITE: NEGATIVE
Nitrite: NEGATIVE
Protein, ur: 100 mg/dL — AB
Protein, ur: 100 mg/dL — AB
SPECIFIC GRAVITY, URINE: 1.017 (ref 1.005–1.030)
Specific Gravity, Urine: 1.026 (ref 1.005–1.030)
pH: 6 (ref 5.0–8.0)
pH: 6 (ref 5.0–8.0)

## 2018-08-03 LAB — CBC WITH DIFFERENTIAL/PLATELET
BASOS ABS: 0 10*3/uL (ref 0.0–0.1)
Basophils Absolute: 0 10*3/uL (ref 0.0–0.1)
Basophils Relative: 0 %
Basophils Relative: 0 %
EOS ABS: 0.1 10*3/uL (ref 0.0–0.7)
EOS PCT: 1 %
EOS PCT: 0 %
Eosinophils Absolute: 0 10*3/uL (ref 0.0–0.7)
HCT: 48.7 % (ref 39.0–52.0)
HCT: 46 % (ref 39.0–52.0)
Hemoglobin: 17.4 g/dL — ABNORMAL HIGH (ref 13.0–17.0)
Hemoglobin: 16.2 g/dL (ref 13.0–17.0)
LYMPHS ABS: 1.3 10*3/uL (ref 0.7–4.0)
LYMPHS PCT: 12 %
LYMPHS PCT: 10 %
Lymphs Abs: 1.1 10*3/uL (ref 0.7–4.0)
MCH: 36 pg — ABNORMAL HIGH (ref 26.0–34.0)
MCH: 35.4 pg — AB (ref 26.0–34.0)
MCHC: 35.7 g/dL (ref 30.0–36.0)
MCHC: 35.2 g/dL (ref 30.0–36.0)
MCV: 100.6 fL — AB (ref 78.0–100.0)
MCV: 100.7 fL — AB (ref 78.0–100.0)
MONO ABS: 0.5 10*3/uL (ref 0.1–1.0)
Monocytes Absolute: 0.9 10*3/uL (ref 0.1–1.0)
Monocytes Relative: 5 %
Monocytes Relative: 7 %
Neutro Abs: 7.3 10*3/uL (ref 1.7–7.7)
Neutro Abs: 10.8 10*3/uL — ABNORMAL HIGH (ref 1.7–7.7)
Neutrophils Relative %: 82 %
Neutrophils Relative %: 83 %
PLATELETS: 151 10*3/uL (ref 150–400)
PLATELETS: 144 10*3/uL — AB (ref 150–400)
RBC: 4.84 MIL/uL (ref 4.22–5.81)
RBC: 4.57 MIL/uL (ref 4.22–5.81)
RDW: 14.4 % (ref 11.5–15.5)
RDW: 14.5 % (ref 11.5–15.5)
WBC: 8.8 10*3/uL (ref 4.0–10.5)
WBC: 12.9 10*3/uL — ABNORMAL HIGH (ref 4.0–10.5)

## 2018-08-03 LAB — RAPID URINE DRUG SCREEN, HOSP PERFORMED
Amphetamines: NOT DETECTED
Amphetamines: NOT DETECTED
BENZODIAZEPINES: NOT DETECTED
Barbiturates: NOT DETECTED
Barbiturates: NOT DETECTED
Benzodiazepines: NOT DETECTED
COCAINE: NOT DETECTED
Cocaine: NOT DETECTED
OPIATES: NOT DETECTED
OPIATES: NOT DETECTED
Tetrahydrocannabinol: NOT DETECTED
Tetrahydrocannabinol: NOT DETECTED

## 2018-08-03 LAB — COMPREHENSIVE METABOLIC PANEL
ALK PHOS: 103 U/L (ref 38–126)
ALT: 43 U/L (ref 0–44)
AST: 53 U/L — AB (ref 15–41)
Albumin: 4.2 g/dL (ref 3.5–5.0)
Anion gap: 17 — ABNORMAL HIGH (ref 5–15)
BILIRUBIN TOTAL: 1.8 mg/dL — AB (ref 0.3–1.2)
BUN: 12 mg/dL (ref 6–20)
CALCIUM: 9.3 mg/dL (ref 8.9–10.3)
CHLORIDE: 95 mmol/L — AB (ref 98–111)
CO2: 21 mmol/L — ABNORMAL LOW (ref 22–32)
CREATININE: 1.04 mg/dL (ref 0.61–1.24)
Glucose, Bld: 206 mg/dL — ABNORMAL HIGH (ref 70–99)
Potassium: 3.8 mmol/L (ref 3.5–5.1)
Sodium: 133 mmol/L — ABNORMAL LOW (ref 135–145)
TOTAL PROTEIN: 7.9 g/dL (ref 6.5–8.1)

## 2018-08-03 LAB — BASIC METABOLIC PANEL
Anion gap: 10 (ref 5–15)
BUN: 13 mg/dL (ref 6–20)
CALCIUM: 9.2 mg/dL (ref 8.9–10.3)
CO2: 28 mmol/L (ref 22–32)
CREATININE: 1.02 mg/dL (ref 0.61–1.24)
Chloride: 94 mmol/L — ABNORMAL LOW (ref 98–111)
GFR calc Af Amer: >60 mL/min (ref >60)
GLUCOSE: 153 mg/dL — AB (ref 70–99)
POTASSIUM: 3.7 mmol/L (ref 3.5–5.1)
Sodium: 132 mmol/L — ABNORMAL LOW (ref 135–145)

## 2018-08-03 LAB — ETHANOL: Alcohol, Ethyl (B): <10 mg/dL (ref <10)

## 2018-08-03 LAB — CBG MONITORING, ED: Glucose-Capillary: 223 mg/dL — ABNORMAL HIGH (ref 70–99)

## 2018-08-03 LAB — I-STAT CG4 LACTIC ACID, ED: LACTIC ACID, VENOUS: 7.18 mmol/L — AB (ref 0.5–1.9)

## 2018-08-03 LAB — CK: Total CK: 131 U/L (ref 49–397)

## 2018-08-03 LAB — TSH: TSH: 5.061 u[IU]/mL — ABNORMAL HIGH (ref 0.350–4.500)

## 2018-08-03 MED ORDER — VITAMIN B-1 100 MG PO TABS
100.0000 mg | ORAL_TABLET | Freq: Every day | ORAL | Status: DC
  Administered 2018-08-03 – 2018-08-05 (×3): 100 mg via ORAL
  Filled 2018-08-03 (×3): qty 1

## 2018-08-03 MED ORDER — LEVETIRACETAM IN NACL 500 MG/100ML IV SOLN
500.0000 mg | Freq: Two times a day (BID) | INTRAVENOUS | Status: DC
  Administered 2018-08-03 – 2018-08-04 (×3): 500 mg via INTRAVENOUS
  Filled 2018-08-03 (×6): qty 100

## 2018-08-03 MED ORDER — PAROXETINE HCL 20 MG PO TABS
30.0000 mg | ORAL_TABLET | Freq: Every day | ORAL | Status: DC
  Administered 2018-08-04 – 2018-08-05 (×2): 30 mg via ORAL
  Filled 2018-08-03: qty 1.5
  Filled 2018-08-03: qty 2
  Filled 2018-08-03: qty 1.5
  Filled 2018-08-03: qty 2
  Filled 2018-08-03 (×2): qty 1.5

## 2018-08-03 MED ORDER — ONDANSETRON HCL 4 MG/2ML IJ SOLN
4.0000 mg | Freq: Four times a day (QID) | INTRAMUSCULAR | Status: DC | PRN
  Administered 2018-08-03: 4 mg via INTRAVENOUS
  Filled 2018-08-03: qty 2

## 2018-08-03 MED ORDER — LORAZEPAM 2 MG/ML IJ SOLN
2.0000 mg | INTRAMUSCULAR | Status: DC | PRN
  Administered 2018-08-03 – 2018-08-04 (×4): 2 mg via INTRAVENOUS
  Filled 2018-08-03 (×4): qty 1

## 2018-08-03 MED ORDER — ONDANSETRON HCL 4 MG PO TABS: 4.0000 mg | ORAL_TABLET | Freq: Four times a day (QID) | ORAL | Status: DC | PRN

## 2018-08-03 MED ORDER — AMLODIPINE BESYLATE 5 MG PO TABS
5.0000 mg | ORAL_TABLET | Freq: Every day | ORAL | Status: DC
  Administered 2018-08-03 – 2018-08-05 (×3): 5 mg via ORAL
  Filled 2018-08-03 (×3): qty 1

## 2018-08-03 MED ORDER — ACETAMINOPHEN 500 MG PO TABS
ORAL_TABLET | ORAL | Status: AC
  Filled 2018-08-03: qty 2

## 2018-08-03 MED ORDER — KETOROLAC TROMETHAMINE 30 MG/ML IJ SOLN: 30.0000 mg | Freq: Four times a day (QID) | INTRAMUSCULAR | Status: DC | PRN

## 2018-08-03 MED ORDER — FOLIC ACID 1 MG PO TABS
1.0000 mg | ORAL_TABLET | Freq: Every day | ORAL | Status: DC
  Administered 2018-08-03 – 2018-08-05 (×3): 1 mg via ORAL
  Filled 2018-08-03 (×3): qty 1

## 2018-08-03 MED ORDER — FAMOTIDINE IN NACL 20-0.9 MG/50ML-% IV SOLN
20.0000 mg | Freq: Two times a day (BID) | INTRAVENOUS | Status: DC
  Administered 2018-08-03 – 2018-08-05 (×4): 20 mg via INTRAVENOUS
  Filled 2018-08-03 (×4): qty 50

## 2018-08-03 MED ORDER — ACETAMINOPHEN 325 MG PO TABS
650.0000 mg | ORAL_TABLET | Freq: Once | ORAL | Status: AC
  Administered 2018-08-03: 650 mg via ORAL
  Filled 2018-08-03: qty 2

## 2018-08-03 MED ORDER — SODIUM CHLORIDE 0.9 % IV SOLN
INTRAVENOUS | Status: DC
  Administered 2018-08-03 – 2018-08-05 (×4): via INTRAVENOUS

## 2018-08-03 MED ORDER — HYDRALAZINE HCL 20 MG/ML IJ SOLN
10.0000 mg | INTRAMUSCULAR | Status: DC | PRN
  Administered 2018-08-05 (×3): 10 mg via INTRAVENOUS
  Filled 2018-08-03 (×3): qty 1

## 2018-08-03 MED ORDER — ONDANSETRON HCL 4 MG/2ML IJ SOLN
4.0000 mg | Freq: Once | INTRAMUSCULAR | Status: AC
  Administered 2018-08-03: 4 mg via INTRAVENOUS
  Filled 2018-08-03: qty 2

## 2018-08-03 MED ORDER — IPRATROPIUM-ALBUTEROL 0.5-2.5 (3) MG/3ML IN SOLN: 3.0000 mL | RESPIRATORY_TRACT | Status: DC | PRN

## 2018-08-03 MED ORDER — ENOXAPARIN SODIUM 40 MG/0.4ML ~~LOC~~ SOLN
40.0000 mg | SUBCUTANEOUS | Status: DC
  Administered 2018-08-03 – 2018-08-04 (×2): 40 mg via SUBCUTANEOUS
  Filled 2018-08-03 (×2): qty 0.4

## 2018-08-03 MED ORDER — LEVETIRACETAM IN NACL 1000 MG/100ML IV SOLN
1000.0000 mg | Freq: Once | INTRAVENOUS | Status: AC
  Administered 2018-08-03: 1000 mg via INTRAVENOUS
  Filled 2018-08-03: qty 100

## 2018-08-03 MED ORDER — ACETAMINOPHEN 500 MG PO TABS
1000.0000 mg | ORAL_TABLET | Freq: Once | ORAL | Status: AC
  Administered 2018-08-03: 1000 mg via ORAL

## 2018-08-03 MED ORDER — ADULT MULTIVITAMIN W/MINERALS CH
1.0000 | ORAL_TABLET | Freq: Every day | ORAL | Status: DC
  Administered 2018-08-03 – 2018-08-05 (×3): 1 via ORAL
  Filled 2018-08-03 (×3): qty 1

## 2018-08-03 NOTE — ED Triage Notes (Signed)
Pt seen earlier this morning for seizure and discharged this am. Pt hasnt drank alcohol since Saturday. Pt was in bed and family heard thump and was in floor. Questionable unwitnessed seizure. Noted to have abrasions to forehead and nose

## 2018-08-03 NOTE — H&P (Signed)
History and Physical  Andrew Knapp ALP:[id number] DOB: 1961-08-31 DOA: 08/03/2018  Referring physician: Roderic Palau  PCP: Asencion Noble, MD   Chief Complaint: Seizures  Historian:    HPI: Andrew Knapp is a 57 y.o. male who presented to ED after having recurrent seizures.  Apparently patient had a seizure last night and was observed in the hospital ER.  Patient has a history of EtOH abuse.  Patient was sent home this morning and then had a second seizure after he returned home.  Patient was found by his girlfriend shaking.  This is a new problem. The current episode started less than 1 hour ago. The problem has been resolved. There were 2 to 3 seizures. The most recent episode lasted 30 to 120 seconds. Pertinent negatives include no headaches, no chest pain, no cough and no diarrhea. Characteristics include rhythmic jerking. The episode was witnessed. There was no sensation of an aura present. The seizures did not continue in the ED.  Possible causes include medication or dosage change. There has been no fever.    Review of Systems: All systems reviewed and apart from history of presenting illness, are negative.  Past Medical History:  Diagnosis Date  . Alcoholism ([place])   . Hypertension    Past Surgical History:  Procedure Laterality Date  . HAND SURGERY     Social History:  reports that he has been smoking cigarettes.  He has been smoking about 0.50 packs per day. He has never used smokeless tobacco. He reports that he drinks about 1.2 oz of alcohol per week. He reports that he does not use drugs.  No Known Allergies  History reviewed. No pertinent family history.  Prior to Admission medications   Medication Sig Start Date End Date Taking? Authorizing Provider  acetaminophen (TYLENOL) 500 MG tablet Take 1,000 mg by mouth every 6 (six) hours as needed for mild pain or headache.   Yes [provider]  Andrew Knapp) 1 MG tablet Take 1 mg by mouth 3 (three) times daily as needed  for anxiety.   Yes [provider]  amLODipine (NORVASC) 5 MG tablet Take 5 mg by mouth daily.   Yes [provider]  ibuprofen (ADVIL,MOTRIN) 200 MG tablet Take 400 mg by mouth every 6 (six) hours as needed for headache or moderate pain.   Yes [provider]  ondansetron (ZOFRAN ODT) 4 MG disintegrating tablet 4mg  ODT q4 hours prn nausea/vomit 06/17/18  Yes Milton Ferguson, MD  PARoxetine (PAXIL) 30 MG tablet Take 30 mg by mouth daily.   Yes [provider]   Physical Exam: Vitals:   08/03/18 1400 08/03/18 1430 08/03/18 1500 08/03/18 1525  BP: (!) 190/105 (!) 185/92 (!) 177/118 (!) 177/118  Pulse: 66 65 63 75  Resp:      Temp:      TempSrc:      SpO2: 93% 95% 93%   Weight:         General exam: Moderately built and nourished patient, lying comfortably supine on the gurney in no obvious distress.  Head, eyes and ENT: Nontraumatic and normocephalic. Pupils equally reacting to light and accommodation. Oral mucosa dry.  Neck: Supple. No JVD, carotid bruit or thyromegaly.  Lymphatics: No lymphadenopathy.  Respiratory system: Clear to auscultation. No increased work of breathing.  Cardiovascular system: S1 and S2 heard, RRR. No JVD, murmurs, gallops, clicks or pedal edema.  Gastrointestinal system: Abdomen is nondistended, soft and nontender. Normal bowel sounds heard. No organomegaly  or masses appreciated.  Central nervous system: Alert and oriented. No focal neurological deficits.  Extremities: Symmetric 5 x 5 power. Peripheral pulses symmetrically felt.   Skin: No rashes or acute findings.  Musculoskeletal system: Negative exam.  Psychiatry: Pleasant and cooperative.  Labs on Admission:  Basic Metabolic Panel: Recent Labs  Lab 08/03/18 0148 08/03/18 1256  NA 133* 132*  K 3.8 3.7  CL 95* 94*  CO2 21* 28  GLUCOSE 206* 153*  BUN 12 13  CREATININE 1.04 1.02  CALCIUM 9.3 9.2   Liver Function Tests: Recent Labs  Lab 08/03/18 0148   AST 53*  ALT 43  ALKPHOS 103  BILITOT 1.8*  PROT 7.9  ALBUMIN 4.2   No results for input(s): LIPASE, AMYLASE in the last 168 hours. No results for input(s): AMMONIA in the last 168 hours. CBC: Recent Labs  Lab 08/03/18 0148 08/03/18 1256  WBC 8.8 12.9*  NEUTROABS 7.3 10.8*  HGB 17.4* 16.2  HCT 48.7 46.0  MCV 100.6* 100.7*  PLT 151 144*   Cardiac Enzymes: Recent Labs  Lab 08/03/18 0148  CKTOTAL 131    BNP (last 3 results) No results for input(s): PROBNP in the last 8760 hours. CBG: Recent Labs  Lab 08/03/18 0145  GLUCAP 223*    Radiological Exams on Admission: Ct Head Wo Contrast  Result Date: 08/03/2018 CLINICAL DATA:  Seizure.  Fall. EXAM: CT HEAD WITHOUT CONTRAST CT MAXILLOFACIAL WITHOUT CONTRAST CT CERVICAL SPINE WITHOUT CONTRAST TECHNIQUE: Multidetector CT imaging of the head, cervical spine, and maxillofacial structures were performed using the standard protocol without intravenous contrast. Multiplanar CT image reconstructions of the cervical spine and maxillofacial structures were also generated. COMPARISON:  August 03, 2018 at 2:08 a.m. FINDINGS: CT HEAD FINDINGS Brain: No subdural, epidural, or subarachnoid hemorrhage. Ventricles and sulci are unremarkable. Cerebellum, brainstem, and basal cisterns are normal. No mass effect or midline shift. No acute cortical ischemia or infarct. Vascular: Calcified atherosclerosis is seen in the intracranial carotids. Skull: Mucosal thickening in the inferior frontal sinuses and scattered throughout the ethmoid air cells again identified. No air-fluid levels. Mastoid air cells and middle ears well aerated. No calvarial fractures are noted. Other: None. CT MAXILLOFACIAL FINDINGS Osseous: Significant odontogenic disease associated with the mandibular teeth with multiple caries and periapical lucencies. No facial bone fractures noted. Orbits: Negative. No traumatic or inflammatory finding. Sinuses: Mucosal thickening is seen in the  inferior frontal sinuses, extending through the frontal sinus drainage pathways. Scattered mucosal thickening in the ethmoid air cells. Mucosal thickening in the left and right maxillary sinuses in addition to the sphenoid sinus. No air-fluid levels. No sinus wall fractures. Soft tissues: Soft tissue swelling is associated with the nose, particularly to the left. Extracranial soft tissues are otherwise unremarkable. CT CERVICAL SPINE FINDINGS Alignment: Normal. Skull base and vertebrae: No acute fracture. No primary bone lesion or focal pathologic process. Soft tissues and spinal canal: No prevertebral fluid or swelling. No visible canal hematoma. Disc levels: Multilevel degenerative changes with anterior and posterior osteophytes. Facet degenerative changes. Upper chest: Negative. Other: No other abnormalities. IMPRESSION: 1. No acute intracranial abnormalities. 2. No facial bone fractures. 3. No fracture or traumatic malalignment in the cervical spine. 4. Significant mandibular odontogenic changes as above. 5. Sinus disease as above. Electronically Signed   By: Dorise Bullion III M.D   On: 08/03/2018 13:30   Ct Head Wo Contrast  Result Date: 08/03/2018 CLINICAL DATA:  Patient difficult to arouse per wife. EXAM: CT HEAD WITHOUT CONTRAST TECHNIQUE:  Contiguous axial images were obtained from the base of the skull through the vertex without intravenous contrast. COMPARISON:  Maxillofacial CT 09/15/2004 FINDINGS: Brain: No evidence of acute infarction, hemorrhage, hydrocephalus, extra-axial collection or mass lesion/mass effect. Vascular: Moderate atherosclerosis of the carotid siphons. Skull: No skull fracture or suspicious osseous lesions. Sinuses/Orbits: Moderate anterior ethmoid and frontal sinus mucosal thickening. Mild sphenoid and left maxillary sinus mucosal thickening are also noted. No air-fluid levels. Intact orbits and globes. Other: None IMPRESSION: No acute intracranial abnormality. Mild-to-moderate  paranasal sinus mucosal thickening. Electronically Signed   By: Ashley Royalty M.D.   On: 08/03/2018 02:22   Ct Cervical Spine Wo Contrast  Result Date: 08/03/2018 CLINICAL DATA:  Seizure.  Fall. EXAM: CT HEAD WITHOUT CONTRAST CT MAXILLOFACIAL WITHOUT CONTRAST CT CERVICAL SPINE WITHOUT CONTRAST TECHNIQUE: Multidetector CT imaging of the head, cervical spine, and maxillofacial structures were performed using the standard protocol without intravenous contrast. Multiplanar CT image reconstructions of the cervical spine and maxillofacial structures were also generated. COMPARISON:  August 03, 2018 at 2:08 a.m. FINDINGS: CT HEAD FINDINGS Brain: No subdural, epidural, or subarachnoid hemorrhage. Ventricles and sulci are unremarkable. Cerebellum, brainstem, and basal cisterns are normal. No mass effect or midline shift. No acute cortical ischemia or infarct. Vascular: Calcified atherosclerosis is seen in the intracranial carotids. Skull: Mucosal thickening in the inferior frontal sinuses and scattered throughout the ethmoid air cells again identified. No air-fluid levels. Mastoid air cells and middle ears well aerated. No calvarial fractures are noted. Other: None. CT MAXILLOFACIAL FINDINGS Osseous: Significant odontogenic disease associated with the mandibular teeth with multiple caries and periapical lucencies. No facial bone fractures noted. Orbits: Negative. No traumatic or inflammatory finding. Sinuses: Mucosal thickening is seen in the inferior frontal sinuses, extending through the frontal sinus drainage pathways. Scattered mucosal thickening in the ethmoid air cells. Mucosal thickening in the left and right maxillary sinuses in addition to the sphenoid sinus. No air-fluid levels. No sinus wall fractures. Soft tissues: Soft tissue swelling is associated with the nose, particularly to the left. Extracranial soft tissues are otherwise unremarkable. CT CERVICAL SPINE FINDINGS Alignment: Normal. Skull base and vertebrae:  No acute fracture. No primary bone lesion or focal pathologic process. Soft tissues and spinal canal: No prevertebral fluid or swelling. No visible canal hematoma. Disc levels: Multilevel degenerative changes with anterior and posterior osteophytes. Facet degenerative changes. Upper chest: Negative. Other: No other abnormalities. IMPRESSION: 1. No acute intracranial abnormalities. 2. No facial bone fractures. 3. No fracture or traumatic malalignment in the cervical spine. 4. Significant mandibular odontogenic changes as above. 5. Sinus disease as above. Electronically Signed   By: Dorise Bullion III M.D   On: 08/03/2018 13:30   Ct Maxillofacial Wo Contrast  Result Date: 08/03/2018 CLINICAL DATA:  Seizure.  Fall. EXAM: CT HEAD WITHOUT CONTRAST CT MAXILLOFACIAL WITHOUT CONTRAST CT CERVICAL SPINE WITHOUT CONTRAST TECHNIQUE: Multidetector CT imaging of the head, cervical spine, and maxillofacial structures were performed using the standard protocol without intravenous contrast. Multiplanar CT image reconstructions of the cervical spine and maxillofacial structures were also generated. COMPARISON:  August 03, 2018 at 2:08 a.m. FINDINGS: CT HEAD FINDINGS Brain: No subdural, epidural, or subarachnoid hemorrhage. Ventricles and sulci are unremarkable. Cerebellum, brainstem, and basal cisterns are normal. No mass effect or midline shift. No acute cortical ischemia or infarct. Vascular: Calcified atherosclerosis is seen in the intracranial carotids. Skull: Mucosal thickening in the inferior frontal sinuses and scattered throughout the ethmoid air cells again identified. No air-fluid levels. Mastoid air cells  and middle ears well aerated. No calvarial fractures are noted. Other: None. CT MAXILLOFACIAL FINDINGS Osseous: Significant odontogenic disease associated with the mandibular teeth with multiple caries and periapical lucencies. No facial bone fractures noted. Orbits: Negative. No traumatic or inflammatory finding.  Sinuses: Mucosal thickening is seen in the inferior frontal sinuses, extending through the frontal sinus drainage pathways. Scattered mucosal thickening in the ethmoid air cells. Mucosal thickening in the left and right maxillary sinuses in addition to the sphenoid sinus. No air-fluid levels. No sinus wall fractures. Soft tissues: Soft tissue swelling is associated with the nose, particularly to the left. Extracranial soft tissues are otherwise unremarkable. CT CERVICAL SPINE FINDINGS Alignment: Normal. Skull base and vertebrae: No acute fracture. No primary bone lesion or focal pathologic process. Soft tissues and spinal canal: No prevertebral fluid or swelling. No visible canal hematoma. Disc levels: Multilevel degenerative changes with anterior and posterior osteophytes. Facet degenerative changes. Upper chest: Negative. Other: No other abnormalities. IMPRESSION: 1. No acute intracranial abnormalities. 2. No facial bone fractures. 3. No fracture or traumatic malalignment in the cervical spine. 4. Significant mandibular odontogenic changes as above. 5. Sinus disease as above. Electronically Signed   By: Dorise Bullion III M.D   On: 08/03/2018 13:30   EKG: Independently reviewed.   Assessment/Plan Principal Problem:   Seizures (HCC) Active Problems:   Chronic alcohol abuse   Alcohol withdrawal seizure (HCC)   Tonic clonic convulsion (HCC)   Leukocytosis  1. Recurrent seizures - strongly suspicious for alcohol withdrawal seizures. He has history of heavy alcohol consumption and reports that his last drink was approximately 3 days ago.  Seizure precautions.  CIWA protocol, Continue keppra IV pending neurology consultation.  Continue neuro checks. EEG ordered.  Follow electrolytes.  ETOH level is negative.  Aspiration precautions.   2. Chronic alcoholism - CIWA protocol and nutritional supplements ordered.   3. Leukocytosis - likely reactive from recent seizure activity.  Will follow.    4. Hyponatremia - suspect from dehydration, treating with NS.   DVT Prophylaxis: lovenox  Code Status: Full   Family Communication: patient   Disposition Plan: stepdown ICU   Critical care Time spent: 6 mins  Irwin Brakeman, MD [facility] Pager [phone]  If 7PM-7AM, please contact night-coverage [email or web address] Password [facility] 08/03/2018, 3:53 PM

## 2018-08-03 NOTE — ED Notes (Signed)
Pt O2 87%, placed on 2L [employer]

## 2018-08-03 NOTE — ED Notes (Signed)
Patient transported to CT 

## 2018-08-03 NOTE — Discharge Instructions (Addendum)
You had a seizure tonight.  You need to follow-up with the neurologist to obtain a brain wave study (EEG).  The neurologist will decide whether you need to be on medication to prevent future seizures.  [place] law states that he may not drive a car until you have gone 6 months without having had a seizure.  Do not drink alcohol.  If you drink, it raises the likelihood of you are having another seizure.

## 2018-08-03 NOTE — ED Provider Notes (Signed)
[facility] EMERGENCY DEPARTMENT Provider Note   CSN: [id number] Arrival date & time: 08/03/18  0129     History   Chief Complaint Chief Complaint  Patient presents with  . Altered Mental Status    HPI Andrew Knapp is a 57 y.o. male.  The history is provided by the EMS personnel and the patient. The history is limited by the condition of the patient (Altered mental status).  Altered Mental Status    He has history of hypertension, and was brought in by ambulance because of altered level of consciousness at home.  EMS was called because patient was unresponsive.  They arrived, he was unresponsive but eventually woke up after about 1 minute of ammonia inhalant.  They relate that he was speaking gibberish initially.  He has improved to where he is now able to speak.  Patient is complaining of a left frontal headache.  He does not know why he is here.  There was no incontinence and no bit lip or tongue.  Past Medical History:  Diagnosis Date  . Hypertension     Patient Active Problem List   Diagnosis Date Noted  . ACHILLES TENDINITIS 02/15/2008  . CALCANEAL FRACTURE, RIGHT 11/02/2007    Past Surgical History:  Procedure Laterality Date  . HAND SURGERY          Home Medications    Prior to Admission medications   Medication Sig Start Date End Date Taking? Authorizing Provider  ALPRAZolam Duanne Moron) 1 MG tablet Take 1 mg by mouth 3 (three) times daily as needed for anxiety.    [provider]  amLODipine (NORVASC) 5 MG tablet Take 5 mg by mouth daily.    [provider]  ibuprofen (ADVIL,MOTRIN) 800 MG tablet Take 1 tablet (800 mg total) by mouth every 8 (eight) hours as needed for moderate pain. 06/17/18   Milton Ferguson, MD  ondansetron (ZOFRAN ODT) 4 MG disintegrating tablet 4mg  ODT q4 hours prn nausea/vomit 06/17/18   Milton Ferguson, MD  PARoxetine (PAXIL) 30 MG tablet Take 30 mg by mouth daily.    [provider]  polyvinyl alcohol (LIQUIFILM  TEARS) 1.4 % ophthalmic solution Place 1 drop into both eyes as needed for dry eyes.    [provider]    Family History No family history on file.  Social History Social History   Tobacco Use  . Smoking status: Current Every Day Smoker    Packs/day: 0.50    Types: Cigarettes  . Smokeless tobacco: Never Used  Substance Use Topics  . Alcohol use: Not Currently  . Drug use: Never     Allergies   Patient has no known allergies.   Review of Systems Review of Systems  Unable to perform ROS: Mental status change     Physical Exam Updated Vital Signs BP (!) 196/103 (BP Location: Left Arm)   Pulse 79   Temp 98.1 F (36.7 C) (Oral)   Resp 18   Ht 6\' 2"  (1.88 m)   Wt 99.8 kg (220 lb)   SpO2 94%   BMI 28.25 kg/m   Physical Exam  Nursing note and vitals reviewed.  57 year old male, resting comfortably and in no acute distress. Vital signs are significant for elevated blood pressure. Oxygen saturation is 94%, which is normal. Head is normocephalic and atraumatic. PERRLA, EOMI. Oropharynx is clear. Neck is nontender and supple without adenopathy or JVD. Back is nontender and there is no CVA tenderness. Lungs are clear without rales,  wheezes, or rhonchi. Chest is nontender. Heart has regular rate and rhythm without murmur. Abdomen is soft, flat, nontender without masses or hepatosplenomegaly and peristalsis is normoactive. Extremities have no cyanosis or edema, full range of motion is present. Skin is warm and dry without rash. Neurologic: Awake and alert and oriented to person and place but only partly oriented to time (knows it is Sunday in August, but does not know the year), he follows commands appropriately, cranial nerves are intact, there are no gross motor or sensory deficits.  ED Treatments / Results  Labs (all labs ordered are listed, but only abnormal results are displayed) Labs Reviewed  COMPREHENSIVE METABOLIC PANEL - Abnormal; Notable for the  following components:      Result Value   Sodium 133 (*)    Chloride 95 (*)    CO2 21 (*)    Glucose, Bld 206 (*)    AST 53 (*)    Total Bilirubin 1.8 (*)    Anion gap 17 (*)    All other components within normal limits  CBC WITH DIFFERENTIAL/PLATELET - Abnormal; Notable for the following components:   Hemoglobin 17.4 (*)    MCV 100.6 (*)    MCH 36.0 (*)    All other components within normal limits  URINALYSIS, ROUTINE W REFLEX MICROSCOPIC - Abnormal; Notable for the following components:   Glucose, UA 150 (*)    Hgb urine dipstick SMALL (*)    Ketones, ur 80 (*)    Protein, ur 100 (*)    Bacteria, UA RARE (*)    All other components within normal limits  I-STAT CG4 LACTIC ACID, ED - Abnormal; Notable for the following components:   Lactic Acid, Venous 7.18 (*)    All other components within normal limits  CBG MONITORING, ED - Abnormal; Notable for the following components:   Glucose-Capillary 223 (*)    All other components within normal limits  ETHANOL  RAPID URINE DRUG SCREEN, HOSP PERFORMED  CK  I-STAT CG4 LACTIC ACID, ED    EKG EKG Interpretation  Date/Time:  Monday August 03 2018 01:40:30 EDT Ventricular Rate:  78 PR Interval:    QRS Duration: 97 QT Interval:  408 QTC Calculation: 465 R Axis:   73 Text Interpretation:  Sinus rhythm Right atrial enlargement When compared with ECG of 11/05/2007, No significant change was found Confirmed by Delora Fuel ([id number]) on 08/03/2018 1:46:28 AM   Radiology Ct Head Wo Contrast  Result Date: 08/03/2018 CLINICAL DATA:  Patient difficult to arouse per wife. EXAM: CT HEAD WITHOUT CONTRAST TECHNIQUE: Contiguous axial images were obtained from the base of the skull through the vertex without intravenous contrast. COMPARISON:  Maxillofacial CT 09/15/2004 FINDINGS: Brain: No evidence of acute infarction, hemorrhage, hydrocephalus, extra-axial collection or mass lesion/mass effect. Vascular: Moderate atherosclerosis of the carotid  siphons. Skull: No skull fracture or suspicious osseous lesions. Sinuses/Orbits: Moderate anterior ethmoid and frontal sinus mucosal thickening. Mild sphenoid and left maxillary sinus mucosal thickening are also noted. No air-fluid levels. Intact orbits and globes. Other: None IMPRESSION: No acute intracranial abnormality. Mild-to-moderate paranasal sinus mucosal thickening. Electronically Signed   By: Ashley Royalty M.D.   On: 08/03/2018 02:22    Procedures Procedures  Medications Ordered in ED Medications  ondansetron [facility]) injection 4 mg (4 mg Intravenous Given 08/03/18 0236)  acetaminophen (TYLENOL) tablet 650 mg (650 mg Oral Given 08/03/18 0350)     Initial Impression / Assessment and Plan / ED Course  I have reviewed the triage  vital signs and the nursing notes.  Pertinent labs & imaging results that were available during my care of the patient were reviewed by me and considered in my medical decision making (see chart for details).  Altered mental status which seems to be improving.  No focal neurologic findings to suggest stroke or transient ischemic attack.  I suspect that there may have been an occult seizure.  Work-up is initiated including lactic acid level and CK to look for occult seizure, CT head, CBC and metabolic panel.  Old records are reviewed, and he has no relevant past visits.  2:24 AM Since wife has arrived and states that he had nausea and vomiting earlier in the evening.  She was watching television and heard a noise in the bedroom and found him on the bed unresponsive and with some gurgling respirations.  She was unable to wake him up at that point which is when she called for EMS.  Lactic acid level is come back elevated as has CK, all pointing to seizures the likely cause of his symptoms.  Head CT appears grossly normal per my reading, radiologist interpretation pending.  3:53 AM He has returned to his normal mental state.  Additional history has become available that  he has been drinking heavily, last drink was slightly more than 24 hours ago.  Alcohol level now is 0.  CT of the head was unremarkable.  Labs showed mildly elevated glucose level, mild elevation of bilirubin, mild hyponatremia.  None of these are felt to be relevant to his seizure, but I strongly suspect that the seizure was alcohol withdrawal.  Patient is advised not to consume ethanol.  He is referred to neurology for outpatient EEG.  Glucose will need to be followed by PCP.  Final Clinical Impressions(s) / ED Diagnoses   Final diagnoses:  Seizure ([place])  Elevated bilirubin  Hyponatremia  Elevated glucose level    ED Discharge Orders    None       Delora Fuel, MD 78/58/85 [phone]

## 2018-08-03 NOTE — ED Triage Notes (Signed)
[facility] called out for wife not being able to wake him up, per [facility] unable to wake him up for several mins and used amonia stick for about 1 min, pt eventually came around and was able to stand but confused

## 2018-08-03 NOTE — ED Notes (Signed)
Date and time results received: 08/03/18 0203   Test: Lactic Critical Value: 7.18  Name of Provider Notified: Roxanne Mins  Orders Received? Or Actions Taken?: N/A

## 2018-08-03 NOTE — ED Provider Notes (Signed)
[facility] EMERGENCY DEPARTMENT Provider Note   CSN: [id number] Arrival date & time: 08/03/18  1118     History   Chief Complaint Chief Complaint  Patient presents with  . Seizures    HPI Andrew Knapp is a 57 y.o. male.  Patient had a seizure last night and was observed in the hospital ER.  Patient has a history of EtOH abuse.  Patient was sent home this morning and then had a second seizure.  Patient was found by his girlfriend shaking.  The history is provided by the patient and a relative. No language interpreter was used.  Seizures   This is a new problem. The current episode started less than 1 hour ago. The problem has been resolved. There were 2 to 3 seizures. The most recent episode lasted 30 to 120 seconds. Pertinent negatives include no headaches, no chest pain, no cough and no diarrhea. Characteristics include rhythmic jerking. The episode was witnessed. There was no sensation of an aura present. The seizures did not continue in the ED. Possible causes include medication or dosage change. There has been no fever.    Past Medical History:  Diagnosis Date  . Alcoholism ([place])   . Hypertension     Patient Active Problem List   Diagnosis Date Noted  . Seizures ([place]) 08/03/2018  . Chronic alcohol abuse 08/03/2018  . Alcohol withdrawal seizure ([place]) 08/03/2018  . 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 ACHILLES TENDINITIS 02/15/2008  . CALCANEAL FRACTURE, RIGHT 11/02/2007    Past Surgical History:  Procedure Laterality Date  . HAND SURGERY          Home Medications    Prior to Admission medications   Medication Sig Start Date End Date Taking? Authorizing Provider  acetaminophen (TYLENOL) 500 MG tablet Take 1,000 mg by mouth every 6 (six) hours as needed for mild pain or headache.   Yes [provider]  ALPRAZolam Duanne Moron) 1 MG tablet Take 1 mg by mouth 3 (three) times daily as needed for anxiety.   Yes [provider]  amLODipine  (NORVASC) 5 MG tablet Take 5 mg by mouth daily.   Yes [provider]  ibuprofen (ADVIL,MOTRIN) 200 MG tablet Take 400 mg by mouth every 6 (six) hours as needed for headache or moderate pain.   Yes [provider]  ondansetron (ZOFRAN ODT) 4 MG disintegrating tablet 4mg  ODT q4 hours prn nausea/vomit 06/17/18  Yes Milton Ferguson, MD  PARoxetine (PAXIL) 30 MG tablet Take 30 mg by mouth daily.   Yes [provider]    Family History No family history on file.  Social History Social History   Tobacco Use  . Smoking status: Current Every Day Smoker    Packs/day: 0.50    Types: Cigarettes  . Smokeless tobacco: Never Used  Substance Use Topics  . Alcohol use: Yes    Alcohol/week: 1.2 oz    Types: 2 Shots of liquor per week    Comment: reports normal couple shots per day  . Drug use: Never     Allergies   Patient has no known allergies.   Review of Systems Review of Systems  Constitutional: Negative for appetite change and fatigue.  HENT: Negative for congestion, ear discharge and sinus pressure.   Eyes: Negative for discharge.  Respiratory: Negative for cough.   Cardiovascular: Negative for chest pain.  Gastrointestinal: Negative for abdominal pain and diarrhea.  Genitourinary: Negative for frequency and hematuria.  Musculoskeletal: Negative for back pain.  Skin: Negative for rash.  Neurological: Positive for seizures. Negative for headaches.  Psychiatric/Behavioral: Negative for hallucinations.     Physical Exam Updated Vital Signs BP (!) 190/105   Pulse 66   Temp 97.9 F (36.6 C) (Oral)   Resp 16   Wt 99.8 kg (220 lb)   SpO2 93%   BMI 28.25 kg/m   Physical Exam  Constitutional: He is oriented to person, place, and time. He appears well-developed.  HENT:  Head: Normocephalic.  Abrasions to forehead and nose.  Eyes: Conjunctivae and EOM are normal. No scleral icterus.  Neck: Neck supple. No thyromegaly present.  Cardiovascular:  Normal rate and regular rhythm. Exam reveals no gallop and no friction rub.  No murmur heard. Pulmonary/Chest: No stridor. He has no wheezes. He has no rales. He exhibits no tenderness.  Abdominal: He exhibits no distension. There is no tenderness. There is no rebound.  Musculoskeletal: Normal range of motion. He exhibits no edema.  Lymphadenopathy:    He has no cervical adenopathy.  Neurological: He is oriented to person, place, and time. He exhibits normal muscle tone. Coordination normal.  Skin: No rash noted. No erythema.  Psychiatric: He has a normal mood and affect. His behavior is normal.     ED Treatments / Results  Labs (all labs ordered are listed, but only abnormal results are displayed) Labs Reviewed  CBC WITH DIFFERENTIAL/PLATELET - Abnormal; Notable for the following components:      Result Value   WBC 12.9 (*)    MCV 100.7 (*)    MCH 35.4 (*)    Platelets 144 (*)    Neutro Abs 10.8 (*)    All other components within normal limits  BASIC METABOLIC PANEL - Abnormal; Notable for the following components:   Sodium 132 (*)    Chloride 94 (*)    Glucose, Bld 153 (*)    All other components within normal limits  ETHANOL  RAPID URINE DRUG SCREEN, HOSP PERFORMED  URINALYSIS, ROUTINE W REFLEX MICROSCOPIC    EKG None  Radiology Ct Head Wo Contrast  Result Date: 08/03/2018 CLINICAL DATA:  Seizure.  Fall. EXAM: CT HEAD WITHOUT CONTRAST CT MAXILLOFACIAL WITHOUT CONTRAST CT CERVICAL SPINE WITHOUT CONTRAST TECHNIQUE: Multidetector CT imaging of the head, cervical spine, and maxillofacial structures were performed using the standard protocol without intravenous contrast. Multiplanar CT image reconstructions of the cervical spine and maxillofacial structures were also generated. COMPARISON:  August 03, 2018 at 2:08 a.m. FINDINGS: CT HEAD FINDINGS Brain: No subdural, epidural, or subarachnoid hemorrhage. Ventricles and sulci are unremarkable. Cerebellum, brainstem, and basal  cisterns are normal. No mass effect or midline shift. No acute cortical ischemia or infarct. Vascular: Calcified atherosclerosis is seen in the intracranial carotids. Skull: Mucosal thickening in the inferior frontal sinuses and scattered throughout the ethmoid air cells again identified. No air-fluid levels. Mastoid air cells and middle ears well aerated. No calvarial fractures are noted. Other: None. CT MAXILLOFACIAL FINDINGS Osseous: Significant odontogenic disease associated with the mandibular teeth with multiple caries and periapical lucencies. No facial bone fractures noted. Orbits: Negative. No traumatic or inflammatory finding. Sinuses: Mucosal thickening is seen in the inferior frontal sinuses, extending through the frontal sinus drainage pathways. Scattered mucosal thickening in the ethmoid air cells. Mucosal thickening in the left and right maxillary sinuses in addition to the sphenoid sinus. No air-fluid levels. No sinus wall fractures. Soft tissues: Soft tissue swelling is associated with the nose, particularly to the left. Extracranial soft tissues are otherwise unremarkable.  CT CERVICAL SPINE FINDINGS Alignment: Normal. Skull base and vertebrae: No acute fracture. No primary bone lesion or focal pathologic process. Soft tissues and spinal canal: No prevertebral fluid or swelling. No visible canal hematoma. Disc levels: Multilevel degenerative changes with anterior and posterior osteophytes. Facet degenerative changes. Upper chest: Negative. Other: No other abnormalities. IMPRESSION: 1. No acute intracranial abnormalities. 2. No facial bone fractures. 3. No fracture or traumatic malalignment in the cervical spine. 4. Significant mandibular odontogenic changes as above. 5. Sinus disease as above. Electronically Signed   By: Dorise Bullion III M.D   On: 08/03/2018 13:30   Ct Head Wo Contrast  Result Date: 08/03/2018 CLINICAL DATA:  Patient difficult to arouse per wife. EXAM: CT HEAD WITHOUT CONTRAST  TECHNIQUE: Contiguous axial images were obtained from the base of the skull through the vertex without intravenous contrast. COMPARISON:  Maxillofacial CT 09/15/2004 FINDINGS: Brain: No evidence of acute infarction, hemorrhage, hydrocephalus, extra-axial collection or mass lesion/mass effect. Vascular: Moderate atherosclerosis of the carotid siphons. Skull: No skull fracture or suspicious osseous lesions. Sinuses/Orbits: Moderate anterior ethmoid and frontal sinus mucosal thickening. Mild sphenoid and left maxillary sinus mucosal thickening are also noted. No air-fluid levels. Intact orbits and globes. Other: None IMPRESSION: No acute intracranial abnormality. Mild-to-moderate paranasal sinus mucosal thickening. Electronically Signed   By: Ashley Royalty M.D.   On: 08/03/2018 02:22   Ct Cervical Spine Wo Contrast  Result Date: 08/03/2018 CLINICAL DATA:  Seizure.  Fall. EXAM: CT HEAD WITHOUT CONTRAST CT MAXILLOFACIAL WITHOUT CONTRAST CT CERVICAL SPINE WITHOUT CONTRAST TECHNIQUE: Multidetector CT imaging of the head, cervical spine, and maxillofacial structures were performed using the standard protocol without intravenous contrast. Multiplanar CT image reconstructions of the cervical spine and maxillofacial structures were also generated. COMPARISON:  August 03, 2018 at 2:08 a.m. FINDINGS: CT HEAD FINDINGS Brain: No subdural, epidural, or subarachnoid hemorrhage. Ventricles and sulci are unremarkable. Cerebellum, brainstem, and basal cisterns are normal. No mass effect or midline shift. No acute cortical ischemia or infarct. Vascular: Calcified atherosclerosis is seen in the intracranial carotids. Skull: Mucosal thickening in the inferior frontal sinuses and scattered throughout the ethmoid air cells again identified. No air-fluid levels. Mastoid air cells and middle ears well aerated. No calvarial fractures are noted. Other: None. CT MAXILLOFACIAL FINDINGS Osseous: Significant odontogenic disease associated with the  mandibular teeth with multiple caries and periapical lucencies. No facial bone fractures noted. Orbits: Negative. No traumatic or inflammatory finding. Sinuses: Mucosal thickening is seen in the inferior frontal sinuses, extending through the frontal sinus drainage pathways. Scattered mucosal thickening in the ethmoid air cells. Mucosal thickening in the left and right maxillary sinuses in addition to the sphenoid sinus. No air-fluid levels. No sinus wall fractures. Soft tissues: Soft tissue swelling is associated with the nose, particularly to the left. Extracranial soft tissues are otherwise unremarkable. CT CERVICAL SPINE FINDINGS Alignment: Normal. Skull base and vertebrae: No acute fracture. No primary bone lesion or focal pathologic process. Soft tissues and spinal canal: No prevertebral fluid or swelling. No visible canal hematoma. Disc levels: Multilevel degenerative changes with anterior and posterior osteophytes. Facet degenerative changes. Upper chest: Negative. Other: No other abnormalities. IMPRESSION: 1. No acute intracranial abnormalities. 2. No facial bone fractures. 3. No fracture or traumatic malalignment in the cervical spine. 4. Significant mandibular odontogenic changes as above. 5. Sinus disease as above. Electronically Signed   By: Dorise Bullion III M.D   On: 08/03/2018 13:30   Ct Maxillofacial Wo Contrast  Result Date: 08/03/2018 CLINICAL DATA:  Seizure.  Fall. EXAM: CT HEAD WITHOUT CONTRAST CT MAXILLOFACIAL WITHOUT CONTRAST CT CERVICAL SPINE WITHOUT CONTRAST TECHNIQUE: Multidetector CT imaging of the head, cervical spine, and maxillofacial structures were performed using the standard protocol without intravenous contrast. Multiplanar CT image reconstructions of the cervical spine and maxillofacial structures were also generated. COMPARISON:  August 03, 2018 at 2:08 a.m. FINDINGS: CT HEAD FINDINGS Brain: No subdural, epidural, or subarachnoid hemorrhage. Ventricles and sulci are  unremarkable. Cerebellum, brainstem, and basal cisterns are normal. No mass effect or midline shift. No acute cortical ischemia or infarct. Vascular: Calcified atherosclerosis is seen in the intracranial carotids. Skull: Mucosal thickening in the inferior frontal sinuses and scattered throughout the ethmoid air cells again identified. No air-fluid levels. Mastoid air cells and middle ears well aerated. No calvarial fractures are noted. Other: None. CT MAXILLOFACIAL FINDINGS Osseous: Significant odontogenic disease associated with the mandibular teeth with multiple caries and periapical lucencies. No facial bone fractures noted. Orbits: Negative. No traumatic or inflammatory finding. Sinuses: Mucosal thickening is seen in the inferior frontal sinuses, extending through the frontal sinus drainage pathways. Scattered mucosal thickening in the ethmoid air cells. Mucosal thickening in the left and right maxillary sinuses in addition to the sphenoid sinus. No air-fluid levels. No sinus wall fractures. Soft tissues: Soft tissue swelling is associated with the nose, particularly to the left. Extracranial soft tissues are otherwise unremarkable. CT CERVICAL SPINE FINDINGS Alignment: Normal. Skull base and vertebrae: No acute fracture. No primary bone lesion or focal pathologic process. Soft tissues and spinal canal: No prevertebral fluid or swelling. No visible canal hematoma. Disc levels: Multilevel degenerative changes with anterior and posterior osteophytes. Facet degenerative changes. Upper chest: Negative. Other: No other abnormalities. IMPRESSION: 1. No acute intracranial abnormalities. 2. No facial bone fractures. 3. No fracture or traumatic malalignment in the cervical spine. 4. Significant mandibular odontogenic changes as above. 5. Sinus disease as above. Electronically Signed   By: Dorise Bullion III M.D   On: 08/03/2018 13:30    Procedures Procedures (including critical care time)  Medications Ordered in  ED Medications  acetaminophen (TYLENOL) 500 MG tablet (has no administration in time range)  levETIRAcetam (KEPPRA) IVPB 1000 mg/100 mL premix (0 mg Intravenous Stopped 08/03/18 1406)  acetaminophen (TYLENOL) tablet 1,000 mg (1,000 mg Oral Given 08/03/18 1427)   CRITICAL CARE Performed by: Milton Ferguson Total critical care time: 45 minutes Critical care time was exclusive of separately billable procedures and treating other patients. Critical care was necessary to treat or prevent imminent or life-threatening deterioration. Critical care was time spent personally by me on the following activities: development of treatment plan with patient and/or surrogate as well as nursing, discussions with consultants, evaluation of patient's response to treatment, examination of patient, obtaining history from patient or surrogate, ordering and performing treatments and interventions, ordering and review of laboratory studies, ordering and review of radiographic studies, pulse oximetry and re-evaluation of patient's condition.   Initial Impression / Assessment and Plan / ED Course  I have reviewed the triage vital signs and the nursing notes.  Pertinent labs & imaging results that were available during my care of the patient were reviewed by me and considered in my medical decision making (see chart for details).    Labs unremarkable.  CT scan shows no significant injury from hitting his head.  Patient will be admitted for multiple seizures in the last 24 hours.   Final Clinical Impressions(s) / ED Diagnoses   Final diagnoses:  None    ED  Discharge Orders    None       Milton Ferguson, MD 08/03/18 [phone]

## 2018-08-04 ENCOUNTER — Observation Stay (HOSPITAL_COMMUNITY): Payer: Self-pay

## 2018-08-04 ENCOUNTER — Inpatient Hospital Stay (HOSPITAL_COMMUNITY)
Admit: 2018-08-04 | Discharge: 2018-08-04 | Disposition: A | Discharge status: Home / Self Care | Payer: Self-pay | Attending: Neurology | Admitting: Neurology

## 2018-08-04 DIAGNOSIS — E039 Hypothyroidism, unspecified: Secondary | ICD-10-CM | POA: Diagnosis present

## 2018-08-04 LAB — COMPREHENSIVE METABOLIC PANEL
ALT: 33 U/L (ref 0–44)
AST: 43 U/L — AB (ref 15–41)
Albumin: 3.7 g/dL (ref 3.5–5.0)
Alkaline Phosphatase: 88 U/L (ref 38–126)
Anion gap: 6 (ref 5–15)
BUN: 11 mg/dL (ref 6–20)
CHLORIDE: 97 mmol/L — AB (ref 98–111)
CO2: 32 mmol/L (ref 22–32)
Calcium: 9.1 mg/dL (ref 8.9–10.3)
Creatinine, Ser: 1.11 mg/dL (ref 0.61–1.24)
GFR calc non Af Amer: >60 mL/min (ref >60)
Glucose, Bld: 105 mg/dL — ABNORMAL HIGH (ref 70–99)
POTASSIUM: 3.6 mmol/L (ref 3.5–5.1)
SODIUM: 135 mmol/L (ref 135–145)
Total Bilirubin: 1.5 mg/dL — ABNORMAL HIGH (ref 0.3–1.2)
Total Protein: 7 g/dL (ref 6.5–8.1)

## 2018-08-04 LAB — CBC WITH DIFFERENTIAL/PLATELET
Basophils Absolute: 0 10*3/uL (ref 0.0–0.1)
Basophils Relative: 0 %
Eosinophils Absolute: 0.3 10*3/uL (ref 0.0–0.7)
Eosinophils Relative: 3 %
HCT: 45.4 % (ref 39.0–52.0)
HEMOGLOBIN: 15.7 g/dL (ref 13.0–17.0)
LYMPHS ABS: 2.8 10*3/uL (ref 0.7–4.0)
LYMPHS PCT: 30 %
MCH: 35.6 pg — ABNORMAL HIGH (ref 26.0–34.0)
MCHC: 34.6 g/dL (ref 30.0–36.0)
MCV: 102.9 fL — AB (ref 78.0–100.0)
MONOS PCT: 7 %
Monocytes Absolute: 0.7 10*3/uL (ref 0.1–1.0)
NEUTROS PCT: 60 %
Neutro Abs: 5.8 10*3/uL (ref 1.7–7.7)
Platelets: 128 10*3/uL — ABNORMAL LOW (ref 150–400)
RBC: 4.41 MIL/uL (ref 4.22–5.81)
RDW: 14.4 % (ref 11.5–15.5)
WBC: 9.6 10*3/uL (ref 4.0–10.5)

## 2018-08-04 LAB — HIV ANTIBODY (ROUTINE TESTING W REFLEX): HIV SCREEN 4TH GENERATION: NONREACTIVE

## 2018-08-04 LAB — MAGNESIUM: MAGNESIUM: 2.1 mg/dL (ref 1.7–2.4)

## 2018-08-04 LAB — MRSA PCR SCREENING: MRSA BY PCR: NEGATIVE

## 2018-08-04 LAB — T4, FREE: FREE T4: 0.72 ng/dL — AB (ref 0.82–1.77)

## 2018-08-04 MED ORDER — LEVOTHYROXINE SODIUM 25 MCG PO TABS
125.0000 ug | ORAL_TABLET | Freq: Every day | ORAL | Status: DC
  Administered 2018-08-05: 125 ug via ORAL
  Filled 2018-08-04: qty 1

## 2018-08-04 MED ORDER — HALOPERIDOL LACTATE 5 MG/ML IJ SOLN
2.5000 mg | Freq: Once | INTRAMUSCULAR | Status: AC
  Administered 2018-08-04: 2.5 mg via INTRAVENOUS
  Filled 2018-08-04: qty 1

## 2018-08-04 MED ORDER — ACETAMINOPHEN 325 MG PO TABS: 650.0000 mg | ORAL_TABLET | Freq: Four times a day (QID) | ORAL | Status: DC | PRN

## 2018-08-04 MED ORDER — LISINOPRIL 5 MG PO TABS
15.0000 mg | ORAL_TABLET | Freq: Every day | ORAL | Status: DC
  Administered 2018-08-04 – 2018-08-05 (×2): 15 mg via ORAL
  Filled 2018-08-04 (×2): qty 1

## 2018-08-04 MED ORDER — NICOTINE 21 MG/24HR TD PT24
21.0000 mg | MEDICATED_PATCH | Freq: Every day | TRANSDERMAL | Status: DC
  Administered 2018-08-04: 21 mg via TRANSDERMAL
  Filled 2018-08-04 (×2): qty 1

## 2018-08-04 NOTE — Consult Note (Addendum)
Cact[place] A. Merlene Laughter, MD     [email or web address]          Andrew Knapp is an 57 y.o. male.   ASSESSMENT/PLAN: 1.  New onset seizures in the setting of alcohol withdrawal.  The diagnosis is most likely due to alcohol withdrawal seizures as no clear indication of recurrence of seizures unprovoked.  However, the patient be worked up with an EEG and MRI.  MRI will be done with and without contrast.  If the above work-up is negative, I would recommend discontinuing the Keppra.  2.  Alcoholism: Alcohol cessation is discussed with the patient.  The patient is a 57 year old white male who presents with the recurrent seizures about 2 days after he discontinued using alcohol.  He consumes quite a bit of alcohol on a daily basis.  Does have a long history of alcoholism.  The patient reports that he did quit consuming alcohol for about 12 years but recently has restarted.  This history is confirmed by his mother who is at the bedside.  There is no reported history of seizures.  No sniffing history of seizures infection, seizures or head injuries.  The review system system is otherwise negative.  GENERAL: Pleasant male who is in no acute distress.  He appears hyperemic with slightly puffy appearance typical features of chronic alcoholism.  HEENT: Moderate to large frontal bruising noted.  Neck is supple.  ABDOMEN: soft  EXTREMITIES: No edema   BACK: This is normal.  SKIN: Normal by inspection.    MENTAL STATUS: Alert and oriented. Speech, language and cognition are generally intact. Judgment and insight normal.   CRANIAL NERVES: Pupils are equal, round and reactive to light and accomodation; extra ocular movements are full, there is no significant nystagmus; visual fields are full; upper and lower facial muscles are normal in strength and symmetric, there is no flattening of the nasolabial folds; tongue is midline; uvula is midline; shoulder elevation is normal.  MOTOR: Normal  tone, bulk and strength; no pronator drift.  COORDINATION: Left finger to nose is normal, right finger to nose is normal, No rest tremor; no intention tremor; no postural tremor; no bradykinesia.  REFLEXES: Deep tendon reflexes are symmetrical and normal.   SENSATION: Normal to light touch, temperature, and pain.      Blood pressure (!) 168/89, pulse 72, temperature 98.3 F (36.8 C), temperature source Oral, resp. rate 19, height _0  (1.88 m), weight 192 lb 10.9 oz (87.4 kg), SpO2 95 %.  Past Medical History:  Diagnosis Date  . Alcoholism ([place])   . Hypertension     Past Surgical History:  Procedure Laterality Date  . HAND SURGERY      History reviewed. No pertinent family history.  Social History:  reports that he has been smoking cigarettes.  He has been smoking about 0.50 packs per day. He has never used smokeless tobacco. He reports that he drinks about 1.2 oz of alcohol per week. He reports that he does not use drugs.  Allergies: No Known Allergies  Medications: Prior to Admission medications   Medication Sig Start Date End Date Taking? Authorizing Provider  acetaminophen (TYLENOL) 500 MG tablet Take 1,000 mg by mouth every 6 (six) hours as needed for mild pain or headache.   Yes [provider]  ALPRAZolam Duanne Moron) 1 MG tablet Take 1 mg by mouth 3 (three) times daily as needed for anxiety.   Yes [provider]  amLODipine (NORVASC) 5 MG tablet Take 5 mg  by mouth daily.   Yes [provider]  ibuprofen (ADVIL,MOTRIN) 200 MG tablet Take 400 mg by mouth every 6 (six) hours as needed for headache or moderate pain.   Yes [provider]  ondansetron (ZOFRAN ODT) 4 MG disintegrating tablet 6m ODT q4 hours prn nausea/vomit 06/17/18  Yes ZMilton Ferguson MD  PARoxetine (PAXIL) 30 MG tablet Take 30 mg by mouth daily.   Yes [provider]    Scheduled Meds: . amLODipine  5 mg Oral Daily  . enoxaparin (LOVENOX) injection  40 mg  Subcutaneous Q24H  . folic acid  1 mg Oral Daily  . lisinopril  15 mg Oral Daily  . multivitamin with minerals  1 tablet Oral Daily  . PARoxetine  30 mg Oral Daily  . thiamine  100 mg Oral Daily   Continuous Infusions: . sodium chloride 50 mL/hr at 08/04/18 0823  . famotidine (PEPCID) IV 20 mg (08/03/18 1749)  . levETIRAcetam Stopped (08/03/18 2215)   PRN Meds:.hydrALAZINE, ipratropium-albuterol, ketorolac, LORazepam, ondansetron **OR** ondansetron (ZOFRAN) IV     Results for orders placed or performed during the hospital encounter of 08/03/18 (from the past 48 hour(s))  TSH     Status: Abnormal   Collection Time: 08/03/18 11:43 AM  Result Value Ref Range   TSH 5.061 (H) 0.350 - 4.500 uIU/mL    Comment: Performed by a 3rd Generation assay with a functional sensitivity of <=0.01 uIU/mL. [facility] [address], [place] [address]  CBC with Differential/Platelet     Status: Abnormal   Collection Time: 08/03/18 12:56 PM  Result Value Ref Range   WBC 12.9 (H) 4.0 - 10.5 K/uL   RBC 4.57 4.22 - 5.81 MIL/uL   Hemoglobin 16.2 13.0 - 17.0 g/dL   HCT 46.0 39.0 - 52.0 %   MCV 100.7 (H) 78.0 - 100.0 fL   MCH 35.4 (H) 26.0 - 34.0 pg   MCHC 35.2 30.0 - 36.0 g/dL   RDW 14.5 11.5 - 15.5 %   Platelets 144 (L) 150 - 400 K/uL   Neutrophils Relative % 83 %   Neutro Abs 10.8 (H) 1.7 - 7.7 K/uL   Lymphocytes Relative 10 %   Lymphs Abs 1.3 0.7 - 4.0 K/uL   Monocytes Relative 7 %   Monocytes Absolute 0.9 0.1 - 1.0 K/uL   Eosinophils Relative 0 %   Eosinophils Absolute 0.0 0.0 - 0.7 K/uL   Basophils Relative 0 %   Basophils Absolute 0.0 0.0 - 0.1 K/uL    Comment: Performed at [facility] [address] [place] [id number] Basic metabolic panel     Status: Abnormal   Collection Time: 08/03/18 12:56 PM  Result Value Ref Range   Sodium 132 (L) 135 - 145 mmol/L   Potassium 3.7 3.5 - 5.1 mmol/L   Chloride 94 (L) 98 - 111 mmol/L   CO2 28 22 - 32 mmol/L    Glucose, Bld 153 (H) 70 - 99 mg/dL   BUN 13 6 - 20 mg/dL   Creatinine, Ser 1.02 0.61 - 1.24 mg/dL   Calcium 9.2 8.9 - 10.3 mg/dL   GFR calc non Af Amer >60 >60 mL/min   GFR calc Af Amer >60 >60 mL/min    Comment: (NOTE) The eGFR has been calculated using the CKD EPI equation. This calculation has not been validated in all clinical situations. eGFR's persistently <60 mL/min signify possible Chronic Kidney Disease.    Anion gap 10  5 - 15    Comment: Performed at [facility], [address], [place], [place] [address]  HIV antibody (Routine Testing)     Status: None   Collection Time: 08/03/18 12:57 PM  Result Value Ref Range   HIV Screen 4th Generation wRfx Non Reactive Non Reactive    Comment: (NOTE) Performed At: [facility] [place], [place] [id number] Rush Farmer MD HQ:[id number]   Ethanol     Status: None   Collection Time: 08/03/18 12:58 PM  Result Value Ref Range   Alcohol, Ethyl (B) <10 <10 mg/dL    Comment: (NOTE) Lowest detectable limit for serum alcohol is 10 mg/dL. For medical purposes only. Performed at [facility], [address] [place], [place] [address]   Rapid urine drug screen (hospital performed)     Status: None   Collection Time: 08/03/18  2:38 PM  Result Value Ref Range   Opiates NONE DETECTED NONE DETECTED   Cocaine NONE DETECTED NONE DETECTED   Benzodiazepines NONE DETECTED NONE DETECTED   Amphetamines NONE DETECTED NONE DETECTED   Tetrahydrocannabinol NONE DETECTED NONE DETECTED   Barbiturates NONE DETECTED NONE DETECTED    Comment: (NOTE) DRUG SCREEN FOR MEDICAL PURPOSES ONLY.  IF CONFIRMATION IS NEEDED FOR ANY PURPOSE, NOTIFY LAB WITHIN 5 DAYS. LOWEST DETECTABLE LIMITS FOR URINE DRUG SCREEN Drug Class                     Cutoff (ng/mL) Amphetamine and metabolites    1000 Barbiturate and metabolites    200 Benzodiazepine                 158 Tricyclics and metabolites     300 Opiates and metabolites         300 Cocaine and metabolites        300 THC                            50 Performed at [facility], [address], [place], [place] [address]   Urinalysis, Routine w reflex microscopic     Status: Abnormal   Collection Time: 08/03/18  2:38 PM  Result Value Ref Range   Color, Urine YELLOW YELLOW   APPearance HAZY (A) CLEAR   Specific Gravity, Urine 1.026 1.005 - 1.030   pH 6.0 5.0 - 8.0   Glucose, UA NEGATIVE NEGATIVE mg/dL   Hgb urine dipstick SMALL (A) NEGATIVE   Bilirubin Urine NEGATIVE NEGATIVE   Ketones, ur 5 (A) NEGATIVE mg/dL   Protein, ur 100 (A) NEGATIVE mg/dL   Nitrite NEGATIVE NEGATIVE   Leukocytes, UA NEGATIVE NEGATIVE   RBC / HPF 0-5 0 - 5 RBC/hpf   WBC, UA 0-5 0 - 5 WBC/hpf   Bacteria, UA NONE SEEN NONE SEEN   Squamous Epithelial / LPF 0-5 0 - 5   Mucus PRESENT    Hyaline Casts, UA PRESENT     Comment: Performed at [facility], [address], [place],  [address]  MRSA PCR Screening     Status: None   Collection Time: 08/03/18  5:07 PM  Result Value Ref Range   MRSA by PCR NEGATIVE NEGATIVE    Comment:        The GeneXpert MRSA Assay (FDA approved for NASAL specimens only), is one component of a comprehensive MRSA colonization surveillance program. It is not intended to diagnose MRSA infection nor to guide or monitor treatment for MRSA  infections. Performed at [facility], [address], [place], [place] [address]   Comprehensive metabolic panel     Status: Abnormal   Collection Time: 08/04/18  4:28 AM  Result Value Ref Range   Sodium 135 135 - 145 mmol/L   Potassium 3.6 3.5 - 5.1 mmol/L   Chloride 97 (L) 98 - 111 mmol/L   CO2 32 22 - 32 mmol/L   Glucose, Bld 105 (H) 70 - 99 mg/dL   BUN 11 6 - 20 mg/dL   Creatinine, Ser 1.11 0.61 - 1.24 mg/dL   Calcium 9.1 8.9 - 10.3 mg/dL   Total Protein 7.0 6.5 - 8.1 g/dL   Albumin 3.7 3.5 - 5.0 g/dL   AST 43 (H) 15 - 41 U/L   ALT 33 0 - 44 U/L   Alkaline Phosphatase 88 38 - 126 U/L   Total Bilirubin  1.5 (H) 0.3 - 1.2 mg/dL   GFR calc non Af Amer >60 >60 mL/min   GFR calc Af Amer >60 >60 mL/min    Comment: (NOTE) The eGFR has been calculated using the CKD EPI equation. This calculation has not been validated in all clinical situations. eGFR's persistently <60 mL/min signify possible Chronic Kidney Disease.    Anion gap 6 5 - 15    Comment: Performed at [facility], [address] [place], [place] [address]  CBC WITH DIFFERENTIAL     Status: Abnormal   Collection Time: 08/04/18  4:28 AM  Result Value Ref Range   WBC 9.6 4.0 - 10.5 K/uL   RBC 4.41 4.22 - 5.81 MIL/uL   Hemoglobin 15.7 13.0 - 17.0 g/dL   HCT 45.4 39.0 - 52.0 %   MCV 102.9 (H) 78.0 - 100.0 fL   MCH 35.6 (H) 26.0 - 34.0 pg   MCHC 34.6 30.0 - 36.0 g/dL   RDW 14.4 11.5 - 15.5 %   Platelets 128 (L) 150 - 400 K/uL   Neutrophils Relative % 60 %   Neutro Abs 5.8 1.7 - 7.7 K/uL   Lymphocytes Relative 30 %   Lymphs Abs 2.8 0.7 - 4.0 K/uL   Monocytes Relative 7 %   Monocytes Absolute 0.7 0.1 - 1.0 K/uL   Eosinophils Relative 3 %   Eosinophils Absolute 0.3 0.0 - 0.7 K/uL   Basophils Relative 0 %   Basophils Absolute 0.0 0.0 - 0.1 K/uL    Comment: Performed at [facility], [address], [place], [place] [address]  Magnesium     Status: None   Collection Time: 08/04/18  4:28 AM  Result Value Ref Range   Magnesium 2.1 1.7 - 2.4 mg/dL    Comment: Performed at [facility], [address], [place], [place] [address]    Studies/Results: HEAD CTFACE C SPINE CT FINDINGS: CT HEAD FINDINGS  Brain: No subdural, epidural, or subarachnoid hemorrhage. Ventricles and sulci are unremarkable. Cerebellum, brainstem, and basal cisterns are normal. No mass effect or midline shift. No acute cortical ischemia or infarct.  Vascular: Calcified atherosclerosis is seen in the intracranial carotids.  Skull: Mucosal thickening in the inferior frontal sinuses and scattered throughout the ethmoid air cells again identified.  No air-fluid levels. Mastoid air cells and middle ears well aerated. No calvarial fractures are noted.  Other: None.  CT MAXILLOFACIAL FINDINGS  Osseous: Significant odontogenic disease associated with the mandibular teeth with multiple caries and periapical lucencies. No facial bone fractures noted.  Orbits: Negative. No traumatic or inflammatory finding.  Sinuses: Mucosal thickening is seen  in the inferior frontal sinuses, extending through the frontal sinus drainage pathways. Scattered mucosal thickening in the ethmoid air cells. Mucosal thickening in the left and right maxillary sinuses in addition to the sphenoid sinus. No air-fluid levels. No sinus wall fractures.  Soft tissues: Soft tissue swelling is associated with the nose, particularly to the left. Extracranial soft tissues are otherwise unremarkable.  CT CERVICAL SPINE FINDINGS  Alignment: Normal.  Skull base and vertebrae: No acute fracture. No primary bone lesion or focal pathologic process.  Soft tissues and spinal canal: No prevertebral fluid or swelling. No visible canal hematoma.  Disc levels: Multilevel degenerative changes with anterior and posterior osteophytes. Facet degenerative changes.  Upper chest: Negative.  Other: No other abnormalities.  IMPRESSION: 1. No acute intracranial abnormalities. 2. No facial bone fractures. 3. No fracture or traumatic malalignment in the cervical spine. 4. Significant mandibular odontogenic changes as above. 5. Sinus disease as above.     Malijah Lietz A. Merlene Laughter, M.D.  Diplomate, Tax adviser of Psychiatry and Neurology ( Neurology). 08/04/2018, 8:47 AM

## 2018-08-04 NOTE — Progress Notes (Addendum)
PROGRESS NOTE    Andrew Knapp  UKG:[id number]  DOB: 06-26-1961  DOA: 08/03/2018 PCP: Asencion Noble, MD   Brief Admission Hx: Andrew Knapp is a 57 y.o. male who presented to ED after having recurrent seizures.  MDM/Assessment & Plan:   1. Recurrent seizures - strongly suspicious for alcohol withdrawal seizures. He has history of heavy alcohol consumption and reports that his last drink was approximately 3 days ago.  Seizure precautions.  CIWA protocol, Continue keppra IV pending neurology consultation.  EEG ordered and pending.  Follow electrolytes.  ETOH level is negative.  Aspiration precautions.   2. Chronic alcoholism - CIWA protocol and nutritional supplements ordered.   3. Leukocytosis - RESOLVED.  likely reactive from recent seizure activity.    4. Hyponatremia - RESOLVED. suspect from dehydration, treated with NS.  5. Hypothyroidism - he has low TSH and free T4 level, will begin levothyroxine daily.    DVT Prophylaxis: lovenox  Code Status: Full   Family Communication: patient   Disposition Plan: transfer med surg  Consultants:  neurology  Procedures:  EEG pending  Subjective: No further seizure activity.  Pt much more alert and oriented.   Objective: Vitals:   08/04/18 0315 08/04/18 0330 08/04/18 0345 08/04/18 0424  BP: (!) 152/77 135/75 (!) 145/75 (!) 159/106  Pulse: 62 62 (!) 54 68  Resp: 17 17 17 19   Temp:      TempSrc:      SpO2: 90% 94% 94% 95%  Weight:      Height:        Intake/Output Summary (Last 24 hours) at 08/04/2018 0604 Last data filed at 08/03/2018 1800 Gross per 24 hour  Intake 163.75 ml  Output -  Net 163.75 ml   Filed Weights   08/03/18 1121 08/03/18 1708  Weight: 99.8 kg (220 lb) 86 kg (189 lb 9.5 oz)     REVIEW OF SYSTEMS  As per history otherwise all reviewed and reported negative  Exam:  General exam: awake, alert, NAD, cooperative.   Respiratory system: Clear. No increased work of breathing. Cardiovascular system: S1 & S2  heard, RRR. No JVD, murmurs, gallops, clicks or pedal edema. Gastrointestinal system: Abdomen is nondistended, soft and nontender. Normal bowel sounds heard. Central nervous system: Alert and oriented. No focal neurological deficits. Extremities: no CCE.  Data Reviewed: Basic Metabolic Panel: Recent Labs  Lab 08/03/18 0148 08/03/18 1256 08/04/18 0428  NA 133* 132* 135  K 3.8 3.7 3.6  CL 95* 94* 97*  CO2 21* 28 32  GLUCOSE 206* 153* 105*  BUN 12 13 11   CREATININE 1.04 1.02 1.11  CALCIUM 9.3 9.2 9.1  MG  --   --  2.1   Liver Function Tests: Recent Labs  Lab 08/03/18 0148 08/04/18 0428  AST 53* 43*  ALT 43 33  ALKPHOS 103 88  BILITOT 1.8* 1.5*  PROT 7.9 7.0  ALBUMIN 4.2 3.7   No results for input(s): LIPASE, AMYLASE in the last 168 hours. No results for input(s): AMMONIA in the last 168 hours. CBC: Recent Labs  Lab 08/03/18 0148 08/03/18 1256 08/04/18 0428  WBC 8.8 12.9* 9.6  NEUTROABS 7.3 10.8* 5.8  HGB 17.4* 16.2 15.7  HCT 48.7 46.0 45.4  MCV 100.6* 100.7* 102.9*  PLT 151 144* 128*   Cardiac Enzymes: Recent Labs  Lab 08/03/18 0148  CKTOTAL 131   CBG (last 3)  Recent Labs    08/03/18 0145  GLUCAP 223*   Recent Results (from the past  240 hour(s))  MRSA PCR Screening     Status: None   Collection Time: 08/03/18  5:07 PM  Result Value Ref Range Status   MRSA by PCR NEGATIVE NEGATIVE Final    Comment:        The GeneXpert MRSA Assay (FDA approved for NASAL specimens only), is one component of a comprehensive MRSA colonization surveillance program. It is not intended to diagnose MRSA infection nor to guide or monitor treatment for MRSA infections. Performed at [facility], [address] [place], [place] [address]      Studies: Ct Head Wo Contrast  Result Date: 08/03/2018 CLINICAL DATA:  Seizure.  Fall. EXAM: CT HEAD WITHOUT CONTRAST CT MAXILLOFACIAL WITHOUT CONTRAST CT CERVICAL SPINE WITHOUT CONTRAST TECHNIQUE: Multidetector CT imaging of  the head, cervical spine, and maxillofacial structures were performed using the standard protocol without intravenous contrast. Multiplanar CT image reconstructions of the cervical spine and maxillofacial structures were also generated. COMPARISON:  August 03, 2018 at 2:08 a.m. FINDINGS: CT HEAD FINDINGS Brain: No subdural, epidural, or subarachnoid hemorrhage. Ventricles and sulci are unremarkable. Cerebellum, brainstem, and basal cisterns are normal. No mass effect or midline shift. No acute cortical ischemia or infarct. Vascular: Calcified atherosclerosis is seen in the intracranial carotids. Skull: Mucosal thickening in the inferior frontal sinuses and scattered throughout the ethmoid air cells again identified. No air-fluid levels. Mastoid air cells and middle ears well aerated. No calvarial fractures are noted. Other: None. CT MAXILLOFACIAL FINDINGS Osseous: Significant odontogenic disease associated with the mandibular teeth with multiple caries and periapical lucencies. No facial bone fractures noted. Orbits: Negative. No traumatic or inflammatory finding. Sinuses: Mucosal thickening is seen in the inferior frontal sinuses, extending through the frontal sinus drainage pathways. Scattered mucosal thickening in the ethmoid air cells. Mucosal thickening in the left and right maxillary sinuses in addition to the sphenoid sinus. No air-fluid levels. No sinus wall fractures. Soft tissues: Soft tissue swelling is associated with the nose, particularly to the left. Extracranial soft tissues are otherwise unremarkable. CT CERVICAL SPINE FINDINGS Alignment: Normal. Skull base and vertebrae: No acute fracture. No primary bone lesion or focal pathologic process. Soft tissues and spinal canal: No prevertebral fluid or swelling. No visible canal hematoma. Disc levels: Multilevel degenerative changes with anterior and posterior osteophytes. Facet degenerative changes. Upper chest: Negative. Other: No other abnormalities.  IMPRESSION: 1. No acute intracranial abnormalities. 2. No facial bone fractures. 3. No fracture or traumatic malalignment in the cervical spine. 4. Significant mandibular odontogenic changes as above. 5. Sinus disease as above. Electronically Signed   By: Dorise Bullion III M.D   On: 08/03/2018 13:30   Ct Head Wo Contrast  Result Date: 08/03/2018 CLINICAL DATA:  Patient difficult to arouse per wife. EXAM: CT HEAD WITHOUT CONTRAST TECHNIQUE: Contiguous axial images were obtained from the base of the skull through the vertex without intravenous contrast. COMPARISON:  Maxillofacial CT 09/15/2004 FINDINGS: Brain: No evidence of acute infarction, hemorrhage, hydrocephalus, extra-axial collection or mass lesion/mass effect. Vascular: Moderate atherosclerosis of the carotid siphons. Skull: No skull fracture or suspicious osseous lesions. Sinuses/Orbits: Moderate anterior ethmoid and frontal sinus mucosal thickening. Mild sphenoid and left maxillary sinus mucosal thickening are also noted. No air-fluid levels. Intact orbits and globes. Other: None IMPRESSION: No acute intracranial abnormality. Mild-to-moderate paranasal sinus mucosal thickening. Electronically Signed   By: Ashley Royalty M.D.   On: 08/03/2018 02:22   Ct Cervical Spine Wo Contrast  Result Date: 08/03/2018 CLINICAL DATA:  Seizure.  Fall. EXAM: CT  HEAD WITHOUT CONTRAST CT MAXILLOFACIAL WITHOUT CONTRAST CT CERVICAL SPINE WITHOUT CONTRAST TECHNIQUE: Multidetector CT imaging of the head, cervical spine, and maxillofacial structures were performed using the standard protocol without intravenous contrast. Multiplanar CT image reconstructions of the cervical spine and maxillofacial structures were also generated. COMPARISON:  August 03, 2018 at 2:08 a.m. FINDINGS: CT HEAD FINDINGS Brain: No subdural, epidural, or subarachnoid hemorrhage. Ventricles and sulci are unremarkable. Cerebellum, brainstem, and basal cisterns are normal. No mass effect or midline shift.  No acute cortical ischemia or infarct. Vascular: Calcified atherosclerosis is seen in the intracranial carotids. Skull: Mucosal thickening in the inferior frontal sinuses and scattered throughout the ethmoid air cells again identified. No air-fluid levels. Mastoid air cells and middle ears well aerated. No calvarial fractures are noted. Other: None. CT MAXILLOFACIAL FINDINGS Osseous: Significant odontogenic disease associated with the mandibular teeth with multiple caries and periapical lucencies. No facial bone fractures noted. Orbits: Negative. No traumatic or inflammatory finding. Sinuses: Mucosal thickening is seen in the inferior frontal sinuses, extending through the frontal sinus drainage pathways. Scattered mucosal thickening in the ethmoid air cells. Mucosal thickening in the left and right maxillary sinuses in addition to the sphenoid sinus. No air-fluid levels. No sinus wall fractures. Soft tissues: Soft tissue swelling is associated with the nose, particularly to the left. Extracranial soft tissues are otherwise unremarkable. CT CERVICAL SPINE FINDINGS Alignment: Normal. Skull base and vertebrae: No acute fracture. No primary bone lesion or focal pathologic process. Soft tissues and spinal canal: No prevertebral fluid or swelling. No visible canal hematoma. Disc levels: Multilevel degenerative changes with anterior and posterior osteophytes. Facet degenerative changes. Upper chest: Negative. Other: No other abnormalities. IMPRESSION: 1. No acute intracranial abnormalities. 2. No facial bone fractures. 3. No fracture or traumatic malalignment in the cervical spine. 4. Significant mandibular odontogenic changes as above. 5. Sinus disease as above. Electronically Signed   By: Dorise Bullion III M.D   On: 08/03/2018 13:30   Ct Maxillofacial Wo Contrast  Result Date: 08/03/2018 CLINICAL DATA:  Seizure.  Fall. EXAM: CT HEAD WITHOUT CONTRAST CT MAXILLOFACIAL WITHOUT CONTRAST CT CERVICAL SPINE WITHOUT  CONTRAST TECHNIQUE: Multidetector CT imaging of the head, cervical spine, and maxillofacial structures were performed using the standard protocol without intravenous contrast. Multiplanar CT image reconstructions of the cervical spine and maxillofacial structures were also generated. COMPARISON:  August 03, 2018 at 2:08 a.m. FINDINGS: CT HEAD FINDINGS Brain: No subdural, epidural, or subarachnoid hemorrhage. Ventricles and sulci are unremarkable. Cerebellum, brainstem, and basal cisterns are normal. No mass effect or midline shift. No acute cortical ischemia or infarct. Vascular: Calcified atherosclerosis is seen in the intracranial carotids. Skull: Mucosal thickening in the inferior frontal sinuses and scattered throughout the ethmoid air cells again identified. No air-fluid levels. Mastoid air cells and middle ears well aerated. No calvarial fractures are noted. Other: None. CT MAXILLOFACIAL FINDINGS Osseous: Significant odontogenic disease associated with the mandibular teeth with multiple caries and periapical lucencies. No facial bone fractures noted. Orbits: Negative. No traumatic or inflammatory finding. Sinuses: Mucosal thickening is seen in the inferior frontal sinuses, extending through the frontal sinus drainage pathways. Scattered mucosal thickening in the ethmoid air cells. Mucosal thickening in the left and right maxillary sinuses in addition to the sphenoid sinus. No air-fluid levels. No sinus wall fractures. Soft tissues: Soft tissue swelling is associated with the nose, particularly to the left. Extracranial soft tissues are otherwise unremarkable. CT CERVICAL SPINE FINDINGS Alignment: Normal. Skull base and vertebrae: No acute fracture. No primary  bone lesion or focal pathologic process. Soft tissues and spinal canal: No prevertebral fluid or swelling. No visible canal hematoma. Disc levels: Multilevel degenerative changes with anterior and posterior osteophytes. Facet degenerative changes. Upper  chest: Negative. Other: No other abnormalities. IMPRESSION: 1. No acute intracranial abnormalities. 2. No facial bone fractures. 3. No fracture or traumatic malalignment in the cervical spine. 4. Significant mandibular odontogenic changes as above. 5. Sinus disease as above. Electronically Signed   By: Dorise Bullion III M.D   On: 08/03/2018 13:30   Scheduled Meds: . amLODipine  5 mg Oral Daily  . enoxaparin (LOVENOX) injection  40 mg Subcutaneous Q24H  . folic acid  1 mg Oral Daily  . multivitamin with minerals  1 tablet Oral Daily  . PARoxetine  30 mg Oral Daily  . thiamine  100 mg Oral Daily   Continuous Infusions: . sodium chloride 75 mL/hr at 08/03/18 1749  . famotidine (PEPCID) IV 20 mg (08/03/18 1749)  . levETIRAcetam Stopped (08/03/18 2215)    Principal Problem:   Seizures ([place]) Active Problems:   Chronic alcohol abuse   Alcohol withdrawal seizure ([facility])   Tonic clonic convulsion ([facility])   Leukocytosis   Critical CareTime spent: 33 mins  Irwin Brakeman, MD, FAAFP [facility] Pager [phone]  If 7PM-7AM, please contact night-coverage [email or web address] Password [email or web address] 08/04/2018, 6:04 AM    LOS: 0 days

## 2018-08-04 NOTE — Progress Notes (Signed)
EEG completed, results pending. 

## 2018-08-04 NOTE — Progress Notes (Signed)
Patient arrived to floor at approximately 1145. Patient's VS WNL. Patient is A&OX4. Patient has been put on telemetry monitoring. Will continue to monitor.

## 2018-08-05 DIAGNOSIS — F10239 Alcohol dependence with withdrawal, unspecified: Secondary | ICD-10-CM

## 2018-08-05 DIAGNOSIS — I10 Essential (primary) hypertension: Secondary | ICD-10-CM | POA: Diagnosis present

## 2018-08-05 DIAGNOSIS — R569 Unspecified convulsions: Secondary | ICD-10-CM

## 2018-08-05 DIAGNOSIS — F1010 Alcohol abuse, uncomplicated: Secondary | ICD-10-CM

## 2018-08-05 DIAGNOSIS — D72829 Elevated white blood cell count, unspecified: Secondary | ICD-10-CM

## 2018-08-05 DIAGNOSIS — G40409 Other generalized epilepsy and epileptic syndromes, not intractable, without status epilepticus: Secondary | ICD-10-CM

## 2018-08-05 DIAGNOSIS — E039 Hypothyroidism, unspecified: Secondary | ICD-10-CM

## 2018-08-05 LAB — COMPREHENSIVE METABOLIC PANEL
ALBUMIN: 3.7 g/dL (ref 3.5–5.0)
ALT: 30 U/L (ref 0–44)
AST: 47 U/L — ABNORMAL HIGH (ref 15–41)
Alkaline Phosphatase: 87 U/L (ref 38–126)
Anion gap: 7 (ref 5–15)
BUN: 13 mg/dL (ref 6–20)
CHLORIDE: 98 mmol/L (ref 98–111)
CO2: 29 mmol/L (ref 22–32)
Calcium: 9.1 mg/dL (ref 8.9–10.3)
Creatinine, Ser: 1 mg/dL (ref 0.61–1.24)
GFR calc Af Amer: >60 mL/min (ref >60)
GFR calc non Af Amer: >60 mL/min (ref >60)
GLUCOSE: 111 mg/dL — AB (ref 70–99)
Potassium: 3.4 mmol/L — ABNORMAL LOW (ref 3.5–5.1)
Sodium: 134 mmol/L — ABNORMAL LOW (ref 135–145)
Total Bilirubin: 1.1 mg/dL (ref 0.3–1.2)
Total Protein: 6.9 g/dL (ref 6.5–8.1)

## 2018-08-05 LAB — CBC WITH DIFFERENTIAL/PLATELET
Basophils Absolute: 0 10*3/uL (ref 0.0–0.1)
Basophils Relative: 0 %
EOS PCT: 4 %
Eosinophils Absolute: 0.4 10*3/uL (ref 0.0–0.7)
HCT: 45.1 % (ref 39.0–52.0)
Hemoglobin: 15.7 g/dL (ref 13.0–17.0)
LYMPHS ABS: 3.1 10*3/uL (ref 0.7–4.0)
LYMPHS PCT: 36 %
MCH: 35.7 pg — AB (ref 26.0–34.0)
MCHC: 34.8 g/dL (ref 30.0–36.0)
MCV: 102.5 fL — AB (ref 78.0–100.0)
MONO ABS: 0.7 10*3/uL (ref 0.1–1.0)
Monocytes Relative: 9 %
Neutro Abs: 4.4 10*3/uL (ref 1.7–7.7)
Neutrophils Relative %: 51 %
PLATELETS: 129 10*3/uL — AB (ref 150–400)
RBC: 4.4 MIL/uL (ref 4.22–5.81)
RDW: 14.1 % (ref 11.5–15.5)
WBC: 8.6 10*3/uL (ref 4.0–10.5)

## 2018-08-05 LAB — MAGNESIUM: Magnesium: 1.9 mg/dL (ref 1.7–2.4)

## 2018-08-05 MED ORDER — FOLIC ACID 1 MG PO TABS
1.0000 mg | ORAL_TABLET | Freq: Every day | ORAL | Status: DC
Start: 2018-08-05 — End: 2020-07-28

## 2018-08-05 MED ORDER — POTASSIUM CHLORIDE CRYS ER 20 MEQ PO TBCR
40.0000 meq | EXTENDED_RELEASE_TABLET | Freq: Once | ORAL | Status: AC
  Administered 2018-08-05: 40 meq via ORAL
  Filled 2018-08-05: qty 2

## 2018-08-05 MED ORDER — ADULT MULTIVITAMIN W/MINERALS CH: 1.0000 | ORAL_TABLET | Freq: Every day | ORAL | Status: DC

## 2018-08-05 MED ORDER — LISINOPRIL 20 MG PO TABS: 20.0000 mg | ORAL_TABLET | Freq: Every day | ORAL | 0 refills | Status: DC

## 2018-08-05 MED ORDER — PAROXETINE HCL 30 MG PO TABS: 30.0000 mg | ORAL_TABLET | Freq: Every day | ORAL | 0 refills | Status: DC

## 2018-08-05 MED ORDER — THIAMINE HCL 100 MG PO TABS: 100.0000 mg | ORAL_TABLET | Freq: Every day | ORAL | Status: DC

## 2018-08-05 MED ORDER — AMLODIPINE BESYLATE 10 MG PO TABS: 10.0000 mg | ORAL_TABLET | Freq: Every day | ORAL | 0 refills | Status: DC

## 2018-08-05 MED ORDER — LEVOTHYROXINE SODIUM 125 MCG PO TABS: 125.0000 ug | ORAL_TABLET | Freq: Every day | ORAL | 0 refills | Status: DC

## 2018-08-05 NOTE — Progress Notes (Signed)
Pt's IV catheter removed and intact. Pt's IV site clean dry and intact. Discharge instructions including medications and follow up appointments were reviewed and discussed with patient and his significant other. All questions were answered and no further questions at this time. Pt in stable condition and in no acute distress at time of discharge. Pt escorted by RN.

## 2018-08-05 NOTE — Progress Notes (Addendum)
Patient's B/P 194/75, HR 67.  Hydralazine injection 10 mg PRN administered as ordered. B/P rechecked as follows    08/05/18 1100  Vitals  BP (!) 169/74  BP Location Left Arm  BP Method Automatic  Patient Position (if appropriate) Lying  Pulse Rate 67  Resp 18  Pain Assessment  Pain Scale 0-10   Patient is asymptomatic and denies any discomfort or distress. Pt anxious and insists on being discharged per discharge order   MD has been E-paged and no further changes at this time.

## 2018-08-05 NOTE — Progress Notes (Signed)
Xenia A. Merlene Laughter, MD     [email or web address]          NNAEMEKA SAMSON is an 57 y.o. male.   Assessment/Plan:  1.  UPDATE: EEG and brain MRI are both unrevealing (although MRI was limited due to lack of tolerance).  No need for long-term antiestrogen medication.  I will therefore discontinue the Keppra.  New onset seizures in the setting of alcohol withdrawal.  The diagnosis is most likely due to alcohol withdrawal seizures as no clear indication of recurrence of seizures unprovoked.  However, the patient be worked up with an EEG and MRI.  MRI will be done with and without contrast.  If the above work-up is negative, I would recommend discontinuing the Keppra.  2.  Alcoholism: Alcohol cessation is discussed with the patient.    No recurrent seizures    GENERAL: Pleasant male who is in no acute distress.  He appears hyperemic with slightly puffy appearance typical features of chronic alcoholism.  HEENT:  Frontal bruising noted. Neck is supple.  EXTREMITIES: No edema   BACK: This is normal.  SKIN: Normal by inspection.    MENTAL STATUS: Alert and oriented. Speech, language and cognition are generally intact. Judgment and insight normal.   CRANIAL NERVES: Pupils are equal, round and reactive to light and accomodation; extra ocular movements are full, there is no significant nystagmus; visual fields are full; upper and lower facial muscles are normal in strength and symmetric, there is no flattening of the nasolabial folds; tongue is midline; uvula is midline; shoulder elevation is normal.  MOTOR: Normal tone, bulk and strength; no pronator drift.  COORDINATION: Left finger to nose is normal, right finger to nose is normal, No rest tremor; no intention tremor; no postural tremor; no bradykinesia.        Objective: Vital signs in last 24 hours: Temp:  [98 F (36.7 C)-98.2 F (36.8 C)] 98.2 F (36.8 C) (08/07 0530) Pulse Rate:  [61-70]  63 (08/07 0648) Resp:  [18-20] 18 (08/07 0530) BP: (154-179)/(80-95) 171/89 (08/07 0648) SpO2:  [96 %-98 %] 98 % (08/07 0530)  Intake/Output from previous day: 08/06 0701 - 08/07 0700 In: 4329.6 [P.O.:240; I.V.:2094.6; IV Piggyback:1995] Out: 1500 [Urine:1500] Intake/Output this shift: No intake/output data recorded. Nutritional status:  Diet Order           DIET SOFT Room service appropriate? Yes; Fluid consistency: Thin  Diet effective now           Lab Results: Results for orders placed or performed during the hospital encounter of 08/03/18 (from the past 48 hour(s))  TSH     Status: Abnormal   Collection Time: 08/03/18 11:43 AM  Result Value Ref Range   TSH 5.061 (H) 0.350 - 4.500 uIU/mL    Comment: Performed by a 3rd Generation assay with a functional sensitivity of <=0.01 uIU/mL. Performed at [facility], [address], [place], [place] [address]   CBC with Differential/Platelet     Status: Abnormal   Collection Time: 08/03/18 12:56 PM  Result Value Ref Range   WBC 12.9 (H) 4.0 - 10.5 K/uL   RBC 4.57 4.22 - 5.81 MIL/uL   Hemoglobin 16.2 13.0 - 17.0 g/dL   HCT 46.0 39.0 - 52.0 %   MCV 100.7 (H) 78.0 - 100.0 fL   MCH 35.4 (H) 26.0 - 34.0 pg   MCHC 35.2 30.0 - 36.0 g/dL   RDW 14.5 11.5 - 15.5 %   Platelets 144 (L) 150 -  400 K/uL   Neutrophils Relative % 83 %   Neutro Abs 10.8 (H) 1.7 - 7.7 K/uL   Lymphocytes Relative 10 %   Lymphs Abs 1.3 0.7 - 4.0 K/uL   Monocytes Relative 7 %   Monocytes Absolute 0.9 0.1 - 1.0 K/uL   Eosinophils Relative 0 %   Eosinophils Absolute 0.0 0.0 - 0.7 K/uL   Basophils Relative 0 %   Basophils Absolute 0.0 0.0 - 0.1 K/uL    Comment: Performed at [facility], [address] [place], [place] [address]  Basic metabolic panel     Status: Abnormal   Collection Time: 08/03/18 12:56 PM  Result Value Ref Range   Sodium 132 (L) 135 - 145 mmol/L   Potassium 3.7 3.5 - 5.1 mmol/L   Chloride 94 (L) 98 - 111 mmol/L   CO2 28 22 - 32 mmol/L     Glucose, Bld 153 (H) 70 - 99 mg/dL   BUN 13 6 - 20 mg/dL   Creatinine, Ser 1.02 0.61 - 1.24 mg/dL   Calcium 9.2 8.9 - 10.3 mg/dL   GFR calc non Af Amer >60 >60 mL/min   GFR calc Af Amer >60 >60 mL/min    Comment: (NOTE) The eGFR has been calculated using the CKD EPI equation. This calculation has not been validated in all clinical situations. eGFR's persistently <60 mL/min signify possible Chronic Kidney Disease.    Anion gap 10 5 - 15    Comment: Performed at [facility], [address] [place], [place] [address]  HIV antibody (Routine Testing)     Status: None   Collection Time: 08/03/18 12:57 PM  Result Value Ref Range   HIV Screen 4th Generation wRfx Non Reactive Non Reactive    Comment: (NOTE) Performed At: [facility] [place], [place] [id number] Rush Farmer MD ER:[id number]   Ethanol     Status: None   Collection Time: 08/03/18 12:58 PM  Result Value Ref Range   Alcohol, Ethyl (B) <10 <10 mg/dL    Comment: (NOTE) Lowest detectable limit for serum alcohol is 10 mg/dL. For medical purposes only. Performed at [facility], [address], [place], [place] [address]   Rapid urine drug screen (hospital performed)     Status: None   Collection Time: 08/03/18  2:38 PM  Result Value Ref Range   Opiates NONE DETECTED NONE DETECTED   Cocaine NONE DETECTED NONE DETECTED   Benzodiazepines NONE DETECTED NONE DETECTED   Amphetamines NONE DETECTED NONE DETECTED   Tetrahydrocannabinol NONE DETECTED NONE DETECTED   Barbiturates NONE DETECTED NONE DETECTED    Comment: (NOTE) DRUG SCREEN FOR MEDICAL PURPOSES ONLY.  IF CONFIRMATION IS NEEDED FOR ANY PURPOSE, NOTIFY LAB WITHIN 5 DAYS. LOWEST DETECTABLE LIMITS FOR URINE DRUG SCREEN Drug Class                     Cutoff (ng/mL) Amphetamine and metabolites    1000 Barbiturate and metabolites    200 Benzodiazepine                 970 Tricyclics and metabolites     300 Opiates and metabolites         300 Cocaine and metabolites        300 THC                            50 Performed at [facility], [address],  [place], [place] [address]   Urinalysis, Routine w reflex microscopic     Status: Abnormal   Collection Time: 08/03/18  2:38 PM  Result Value Ref Range   Color, Urine YELLOW YELLOW   APPearance HAZY (A) CLEAR   Specific Gravity, Urine 1.026 1.005 - 1.030   pH 6.0 5.0 - 8.0   Glucose, UA NEGATIVE NEGATIVE mg/dL   Hgb urine dipstick SMALL (A) NEGATIVE   Bilirubin Urine NEGATIVE NEGATIVE   Ketones, ur 5 (A) NEGATIVE mg/dL   Protein, ur 100 (A) NEGATIVE mg/dL   Nitrite NEGATIVE NEGATIVE   Leukocytes, UA NEGATIVE NEGATIVE   RBC / HPF 0-5 0 - 5 RBC/hpf   WBC, UA 0-5 0 - 5 WBC/hpf   Bacteria, UA NONE SEEN NONE SEEN   Squamous Epithelial / LPF 0-5 0 - 5   Mucus PRESENT    Hyaline Casts, UA PRESENT     Comment: Performed at [facility], [address] [place], [place] [address]  T4, free     Status: Abnormal   Collection Time: 08/03/18  3:30 PM  Result Value Ref Range   Free T4 0.72 (L) 0.82 - 1.77 ng/dL    Comment: (NOTE) Biotin ingestion may interfere with free T4 tests. If the results are inconsistent with the TSH level, previous test results, or the clinical presentation, then consider biotin interference. If needed, order repeat testing after stopping biotin. Performed at [facility], [place] [address], [place], [place] [address]   MRSA PCR Screening     Status: None   Collection Time: 08/03/18  5:07 PM  Result Value Ref Range   MRSA by PCR NEGATIVE NEGATIVE    Comment:        The GeneXpert MRSA Assay (FDA approved for NASAL specimens only), is one component of a comprehensive MRSA colonization surveillance program. It is not intended to diagnose MRSA infection nor to guide or monitor treatment for MRSA infections. Performed at [facility], [address], [place], [place] [address]   Comprehensive metabolic panel     Status: Abnormal   Collection  Time: 08/04/18  4:28 AM  Result Value Ref Range   Sodium 135 135 - 145 mmol/L   Potassium 3.6 3.5 - 5.1 mmol/L   Chloride 97 (L) 98 - 111 mmol/L   CO2 32 22 - 32 mmol/L   Glucose, Bld 105 (H) 70 - 99 mg/dL   BUN 11 6 - 20 mg/dL   Creatinine, Ser 1.11 0.61 - 1.24 mg/dL   Calcium 9.1 8.9 - 10.3 mg/dL   Total Protein 7.0 6.5 - 8.1 g/dL   Albumin 3.7 3.5 - 5.0 g/dL   AST 43 (H) 15 - 41 U/L   ALT 33 0 - 44 U/L   Alkaline Phosphatase 88 38 - 126 U/L   Total Bilirubin 1.5 (H) 0.3 - 1.2 mg/dL   GFR calc non Af Amer >60 >60 mL/min   GFR calc Af Amer >60 >60 mL/min    Comment: (NOTE) The eGFR has been calculated using the CKD EPI equation. This calculation has not been validated in all clinical situations. eGFR's persistently <60 mL/min signify possible Chronic Kidney Disease.    Anion gap 6 5 - 15    Comment: Performed at [facility], [address] [place], [place] [address]  CBC WITH DIFFERENTIAL     Status: Abnormal   Collection Time: 08/04/18  4:28 AM  Result Value Ref Range   WBC 9.6 4.0 - 10.5 K/uL  RBC 4.41 4.22 - 5.81 MIL/uL   Hemoglobin 15.7 13.0 - 17.0 g/dL   HCT 45.4 39.0 - 52.0 %   MCV 102.9 (H) 78.0 - 100.0 fL   MCH 35.6 (H) 26.0 - 34.0 pg   MCHC 34.6 30.0 - 36.0 g/dL   RDW 14.4 11.5 - 15.5 %   Platelets 128 (L) 150 - 400 K/uL   Neutrophils Relative % 60 %   Neutro Abs 5.8 1.7 - 7.7 K/uL   Lymphocytes Relative 30 %   Lymphs Abs 2.8 0.7 - 4.0 K/uL   Monocytes Relative 7 %   Monocytes Absolute 0.7 0.1 - 1.0 K/uL   Eosinophils Relative 3 %   Eosinophils Absolute 0.3 0.0 - 0.7 K/uL   Basophils Relative 0 %   Basophils Absolute 0.0 0.0 - 0.1 K/uL    Comment: Performed at [facility], [address], [place], [place] [address]  Magnesium     Status: None   Collection Time: 08/04/18  4:28 AM  Result Value Ref Range   Magnesium 2.1 1.7 - 2.4 mg/dL    Comment: Performed at [facility], [address], [place], [place] [address]  Comprehensive metabolic panel      Status: Abnormal   Collection Time: 08/05/18  5:17 AM  Result Value Ref Range   Sodium 134 (L) 135 - 145 mmol/L   Potassium 3.4 (L) 3.5 - 5.1 mmol/L   Chloride 98 98 - 111 mmol/L   CO2 29 22 - 32 mmol/L   Glucose, Bld 111 (H) 70 - 99 mg/dL   BUN 13 6 - 20 mg/dL   Creatinine, Ser 1.00 0.61 - 1.24 mg/dL   Calcium 9.1 8.9 - 10.3 mg/dL   Total Protein 6.9 6.5 - 8.1 g/dL   Albumin 3.7 3.5 - 5.0 g/dL   AST 47 (H) 15 - 41 U/L   ALT 30 0 - 44 U/L   Alkaline Phosphatase 87 38 - 126 U/L   Total Bilirubin 1.1 0.3 - 1.2 mg/dL   GFR calc non Af Amer >60 >60 mL/min   GFR calc Af Amer >60 >60 mL/min    Comment: (NOTE) The eGFR has been calculated using the CKD EPI equation. This calculation has not been validated in all clinical situations. eGFR's persistently <60 mL/min signify possible Chronic Kidney Disease.    Anion gap 7 5 - 15    Comment: Performed at [facility], [address] [place], [place] [address]  CBC WITH DIFFERENTIAL     Status: Abnormal   Collection Time: 08/05/18  5:17 AM  Result Value Ref Range   WBC 8.6 4.0 - 10.5 K/uL   RBC 4.40 4.22 - 5.81 MIL/uL   Hemoglobin 15.7 13.0 - 17.0 g/dL   HCT 45.1 39.0 - 52.0 %   MCV 102.5 (H) 78.0 - 100.0 fL   MCH 35.7 (H) 26.0 - 34.0 pg   MCHC 34.8 30.0 - 36.0 g/dL   RDW 14.1 11.5 - 15.5 %   Platelets 129 (L) 150 - 400 K/uL   Neutrophils Relative % 51 %   Neutro Abs 4.4 1.7 - 7.7 K/uL   Lymphocytes Relative 36 %   Lymphs Abs 3.1 0.7 - 4.0 K/uL   Monocytes Relative 9 %   Monocytes Absolute 0.7 0.1 - 1.0 K/uL   Eosinophils Relative 4 %   Eosinophils Absolute 0.4 0.0 - 0.7 K/uL   Basophils Relative 0 %   Basophils Absolute 0.0 0.0 - 0.1 K/uL    Comment:  Performed at [facility], [address] [place], [place] [address]  Magnesium     Status: None   Collection Time: 08/05/18  5:17 AM  Result Value Ref Range   Magnesium 1.9 1.7 - 2.4 mg/dL    Comment: Performed at [facility], [address], [place], [place] [address]     Lipid Panel No results for input(s): CHOL, TRIG, HDL, CHOLHDL, VLDL, LDLCALC in the last 72 hours.  Studies/Results: BRAIN MRI FINDINGS: Brain: The patient would not tolerate complete scanning. Diffusion imaging does not show any acute or subacute infarction. No evidence of chronic ischemic changes. No mass, hemorrhage, hydrocephalus or extra-axial collection.  Vascular: Major vessels at the base of the brain show flow.  Skull and upper cervical spine: Negative  Sinuses/Orbits: No inflammatory sinus disease evident. Orbits negative.  Other: None  IMPRESSION: The patient would not tolerate complete scanning. Acquired images including diffusion imaging does not show any acute or subacute infarction or other identifiable intracranial pathology.     Brain MRI is reviewed in person but his scans are limited.  Sagittal T1 and DWI were obtained and are unrevealing.  Other scans cannot be done because of intolerance of the test.  Medications:  Scheduled Meds: . amLODipine  5 mg Oral Daily  . enoxaparin (LOVENOX) injection  40 mg Subcutaneous Q24H  . folic acid  1 mg Oral Daily  . levothyroxine  125 mcg Oral QAC breakfast  . lisinopril  15 mg Oral Daily  . multivitamin with minerals  1 tablet Oral Daily  . nicotine  21 mg Transdermal Daily  . PARoxetine  30 mg Oral Daily  . potassium chloride  40 mEq Oral Once  . thiamine  100 mg Oral Daily   Continuous Infusions: . sodium chloride 50 mL/hr at 08/05/18 0544  . famotidine (PEPCID) IV Stopped (08/04/18 2310)  . levETIRAcetam Stopped (08/04/18 2231)   PRN Meds:.acetaminophen, hydrALAZINE, ipratropium-albuterol, ketorolac, LORazepam, ondansetron **OR** ondansetron (ZOFRAN) IV     LOS: 1 day   Daryon Remmert A. Merlene Laughter, M.D.  Diplomate, Tax adviser of Psychiatry and Neurology ( Neurology).

## 2018-08-05 NOTE — Discharge Summary (Addendum)
Physician Discharge Summary  TETSUO COPPOLA HQP:[id number] DOB: 16-Jul-1961 DOA: 08/03/2018  PCP: Asencion Noble, MD  Admit date: 08/03/2018 Discharge date: 08/05/2018  Admitted From: Home  Disposition: Home   Recommendations for Outpatient Follow-up:  1. Follow up with PCP in 1 weeks 2. Please obtain BMP/CBC and repeat TSH in 4 weeks 3. Please follow up on the following pending results: Final culture data 4. Please enroll in outpatient alcohol treatment program  Discharge Condition: Stable   CODE STATUS: FULL    Brief Hospitalization Summary: Please see all hospital notes, images, labs for full details of the hospitalization.  HPI: Andrew Knapp is a 57 y.o. male who presented to ED after having recurrent seizures.  Apparently patient had a seizure last night and was observed in the hospital ER. Patient has a history of EtOH abuse. Patient was sent home this morning and then had a second seizure after he returned home. Patient was found by his girlfriend shaking.  This is anewproblem. The current episode startedless than 1 hour ago. The problem has beenresolved. There were2 to 3seizures. The most recent episode lasted30 to 120 seconds. Pertinent negatives includeno headaches,no chest pain,no coughand no diarrhea. Characteristics includerhythmic jerking. The episode waswitnessed. There wasno sensation of an aura present. The seizuresdid not continue in the ED.  Possible causes includemedication or dosage change. There has beenno fever.    1. Recurrent seizures - strongly suspicious for alcohol withdrawal seizures. He has history of heavy alcohol consumption and reports that his last drink was approximately 3 days prior to admission.  Seizure precautions.  CIWA protocol.  EEG no acute findings.  Pt was seen by neurology and keppra was discontinued.  Pt strongly advised to avoid alcohol and seek outpatient treatment.     2. Chronic alcoholism - CIWA protocol and nutritional supplements  ordered in hospital.   3. Leukocytosis - RESOLVED.  likely reactive from recent seizure activity.     4. Hyponatremia - RESOLVED. suspect from dehydration, treating with NS.  5. Hypothyroidism - he has low TSH and free T4 level, started on levothyroxine, have him follow up with his PCP to be followed and recheck TSH in 4 weeks.  6. Essential Hypertension - resume amlodipine, added lisinopril 20 mg daily, follow up with PCP in 1 week for recheck.    DVT Prophylaxis: lovenox  Code Status: Full   Family Communication: patient   Disposition Plan: Discharge home  Discharge Diagnoses:  Principal Problem:   Seizures ([place]) Active Problems:   Chronic alcohol abuse   Alcohol withdrawal seizure ([place])   Tonic clonic convulsion ([facility])   Hypothyroidism   Essential hypertension  Discharge Instructions: Discharge Instructions    Call MD for:  difficulty breathing, headache or visual disturbances   Complete by:  As directed    Call MD for:  extreme fatigue   Complete by:  As directed    Call MD for:  hives   Complete by:  As directed    Call MD for:  persistant dizziness or light-headedness   Complete by:  As directed    Call MD for:  persistant nausea and vomiting   Complete by:  As directed    Diet - low sodium heart healthy   Complete by:  As directed    Increase activity slowly   Complete by:  As directed      Allergies as of 08/05/2018   No Known Allergies     Medication List    STOP taking these  medications   acetaminophen 500 MG tablet Commonly known as:  TYLENOL   ALPRAZolam 1 MG tablet Commonly known as:  XANAX   ibuprofen 200 MG tablet Commonly known as:  ADVIL,MOTRIN     TAKE these medications   amLODipine 10 MG tablet Commonly known as:  NORVASC Take 1 tablet (10 mg total) by mouth daily. What changed:    medication strength  how much to take   folic acid 1 MG tablet Commonly known as:  FOLVITE Take 1 tablet (1 mg total) by mouth daily.   levothyroxine  125 MCG tablet Commonly known as:  SYNTHROID, LEVOTHROID Take 1 tablet (125 mcg total) by mouth daily before breakfast. Start taking on:  08/06/2018   lisinopril 20 MG tablet Commonly known as:  PRINIVIL,ZESTRIL Take 1 tablet (20 mg total) by mouth daily.   multivitamin with minerals Tabs tablet Take 1 tablet by mouth daily.   ondansetron 4 MG disintegrating tablet Commonly known as:  ZOFRAN ODT 4mg  ODT q4 hours prn nausea/vomit   PARoxetine 30 MG tablet Commonly known as:  PAXIL Take 1 tablet (30 mg total) by mouth daily.   thiamine 100 MG tablet Take 1 tablet (100 mg total) by mouth daily.      Follow-up Information    Asencion Noble, MD. Schedule an appointment as soon as possible for a visit in 1 week(s).   Specialty:  Internal Medicine Contact information: [address] [place] [address] [phone]          No Known Allergies Allergies as of 08/05/2018   No Known Allergies     Medication List    STOP taking these medications   acetaminophen 500 MG tablet Commonly known as:  TYLENOL   ALPRAZolam 1 MG tablet Commonly known as:  XANAX   ibuprofen 200 MG tablet Commonly known as:  ADVIL,MOTRIN     TAKE these medications   amLODipine 10 MG tablet Commonly known as:  NORVASC Take 1 tablet (10 mg total) by mouth daily. What changed:    medication strength  how much to take   folic acid 1 MG tablet Commonly known as:  FOLVITE Take 1 tablet (1 mg total) by mouth daily.   levothyroxine 125 MCG tablet Commonly known as:  SYNTHROID, LEVOTHROID Take 1 tablet (125 mcg total) by mouth daily before breakfast. Start taking on:  08/06/2018   lisinopril 20 MG tablet Commonly known as:  PRINIVIL,ZESTRIL Take 1 tablet (20 mg total) by mouth daily.   multivitamin with minerals Tabs tablet Take 1 tablet by mouth daily.   ondansetron 4 MG disintegrating tablet Commonly known as:  ZOFRAN ODT 4mg  ODT q4 hours prn nausea/vomit   PARoxetine 30 MG  tablet Commonly known as:  PAXIL Take 1 tablet (30 mg total) by mouth daily.   thiamine 100 MG tablet Take 1 tablet (100 mg total) by mouth daily.       Procedures/Studies: Ct Head Wo Contrast  Result Date: 08/03/2018 CLINICAL DATA:  Seizure.  Fall. EXAM: CT HEAD WITHOUT CONTRAST CT MAXILLOFACIAL WITHOUT CONTRAST CT CERVICAL SPINE WITHOUT CONTRAST TECHNIQUE: Multidetector CT imaging of the head, cervical spine, and maxillofacial structures were performed using the standard protocol without intravenous contrast. Multiplanar CT image reconstructions of the cervical spine and maxillofacial structures were also generated. COMPARISON:  August 03, 2018 at 2:08 a.m. FINDINGS: CT HEAD FINDINGS Brain: No subdural, epidural, or subarachnoid hemorrhage. Ventricles and sulci are unremarkable. Cerebellum, brainstem, and basal cisterns are normal. No mass effect or  midline shift. No acute cortical ischemia or infarct. Vascular: Calcified atherosclerosis is seen in the intracranial carotids. Skull: Mucosal thickening in the inferior frontal sinuses and scattered throughout the ethmoid air cells again identified. No air-fluid levels. Mastoid air cells and middle ears well aerated. No calvarial fractures are noted. Other: None. CT MAXILLOFACIAL FINDINGS Osseous: Significant odontogenic disease associated with the mandibular teeth with multiple caries and periapical lucencies. No facial bone fractures noted. Orbits: Negative. No traumatic or inflammatory finding. Sinuses: Mucosal thickening is seen in the inferior frontal sinuses, extending through the frontal sinus drainage pathways. Scattered mucosal thickening in the ethmoid air cells. Mucosal thickening in the left and right maxillary sinuses in addition to the sphenoid sinus. No air-fluid levels. No sinus wall fractures. Soft tissues: Soft tissue swelling is associated with the nose, particularly to the left. Extracranial soft tissues are otherwise unremarkable. CT  CERVICAL SPINE FINDINGS Alignment: Normal. Skull base and vertebrae: No acute fracture. No primary bone lesion or focal pathologic process. Soft tissues and spinal canal: No prevertebral fluid or swelling. No visible canal hematoma. Disc levels: Multilevel degenerative changes with anterior and posterior osteophytes. Facet degenerative changes. Upper chest: Negative. Other: No other abnormalities. IMPRESSION: 1. No acute intracranial abnormalities. 2. No facial bone fractures. 3. No fracture or traumatic malalignment in the cervical spine. 4. Significant mandibular odontogenic changes as above. 5. Sinus disease as above. Electronically Signed   By: Dorise Bullion III M.D   On: 08/03/2018 13:30   Ct Head Wo Contrast  Result Date: 08/03/2018 CLINICAL DATA:  Patient difficult to arouse per wife. EXAM: CT HEAD WITHOUT CONTRAST TECHNIQUE: Contiguous axial images were obtained from the base of the skull through the vertex without intravenous contrast. COMPARISON:  Maxillofacial CT 09/15/2004 FINDINGS: Brain: No evidence of acute infarction, hemorrhage, hydrocephalus, extra-axial collection or mass lesion/mass effect. Vascular: Moderate atherosclerosis of the carotid siphons. Skull: No skull fracture or suspicious osseous lesions. Sinuses/Orbits: Moderate anterior ethmoid and frontal sinus mucosal thickening. Mild sphenoid and left maxillary sinus mucosal thickening are also noted. No air-fluid levels. Intact orbits and globes. Other: None IMPRESSION: No acute intracranial abnormality. Mild-to-moderate paranasal sinus mucosal thickening. Electronically Signed   By: Ashley Royalty M.D.   On: 08/03/2018 02:22   Ct Cervical Spine Wo Contrast  Result Date: 08/03/2018 CLINICAL DATA:  Seizure.  Fall. EXAM: CT HEAD WITHOUT CONTRAST CT MAXILLOFACIAL WITHOUT CONTRAST CT CERVICAL SPINE WITHOUT CONTRAST TECHNIQUE: Multidetector CT imaging of the head, cervical spine, and maxillofacial structures were performed using the standard  protocol without intravenous contrast. Multiplanar CT image reconstructions of the cervical spine and maxillofacial structures were also generated. COMPARISON:  August 03, 2018 at 2:08 a.m. FINDINGS: CT HEAD FINDINGS Brain: No subdural, epidural, or subarachnoid hemorrhage. Ventricles and sulci are unremarkable. Cerebellum, brainstem, and basal cisterns are normal. No mass effect or midline shift. No acute cortical ischemia or infarct. Vascular: Calcified atherosclerosis is seen in the intracranial carotids. Skull: Mucosal thickening in the inferior frontal sinuses and scattered throughout the ethmoid air cells again identified. No air-fluid levels. Mastoid air cells and middle ears well aerated. No calvarial fractures are noted. Other: None. CT MAXILLOFACIAL FINDINGS Osseous: Significant odontogenic disease associated with the mandibular teeth with multiple caries and periapical lucencies. No facial bone fractures noted. Orbits: Negative. No traumatic or inflammatory finding. Sinuses: Mucosal thickening is seen in the inferior frontal sinuses, extending through the frontal sinus drainage pathways. Scattered mucosal thickening in the ethmoid air cells. Mucosal thickening in the left and right maxillary  sinuses in addition to the sphenoid sinus. No air-fluid levels. No sinus wall fractures. Soft tissues: Soft tissue swelling is associated with the nose, particularly to the left. Extracranial soft tissues are otherwise unremarkable. CT CERVICAL SPINE FINDINGS Alignment: Normal. Skull base and vertebrae: No acute fracture. No primary bone lesion or focal pathologic process. Soft tissues and spinal canal: No prevertebral fluid or swelling. No visible canal hematoma. Disc levels: Multilevel degenerative changes with anterior and posterior osteophytes. Facet degenerative changes. Upper chest: Negative. Other: No other abnormalities. IMPRESSION: 1. No acute intracranial abnormalities. 2. No facial bone fractures. 3. No  fracture or traumatic malalignment in the cervical spine. 4. Significant mandibular odontogenic changes as above. 5. Sinus disease as above. Electronically Signed   By: Dorise Bullion III M.D   On: 08/03/2018 13:30   Mr Brain Wo Contrast  Result Date: 08/04/2018 CLINICAL DATA:  Altered mental status.  Unresponsive episode. EXAM: MRI HEAD WITHOUT CONTRAST TECHNIQUE: Multiplanar, multiecho pulse sequences of the brain and surrounding structures were obtained without intravenous contrast. COMPARISON:  CT examinations done yesterday. FINDINGS: Brain: The patient would not tolerate complete scanning. Diffusion imaging does not show any acute or subacute infarction. No evidence of chronic ischemic changes. No mass, hemorrhage, hydrocephalus or extra-axial collection. Vascular: Major vessels at the base of the brain show flow. Skull and upper cervical spine: Negative Sinuses/Orbits: No inflammatory sinus disease evident. Orbits negative. Other: None IMPRESSION: The patient would not tolerate complete scanning. Acquired images including diffusion imaging does not show any acute or subacute infarction or other identifiable intracranial pathology. Electronically Signed   By: Nelson Chimes M.D.   On: 08/04/2018 11:35   Ct Maxillofacial Wo Contrast  Result Date: 08/03/2018 CLINICAL DATA:  Seizure.  Fall. EXAM: CT HEAD WITHOUT CONTRAST CT MAXILLOFACIAL WITHOUT CONTRAST CT CERVICAL SPINE WITHOUT CONTRAST TECHNIQUE: Multidetector CT imaging of the head, cervical spine, and maxillofacial structures were performed using the standard protocol without intravenous contrast. Multiplanar CT image reconstructions of the cervical spine and maxillofacial structures were also generated. COMPARISON:  August 03, 2018 at 2:08 a.m. FINDINGS: CT HEAD FINDINGS Brain: No subdural, epidural, or subarachnoid hemorrhage. Ventricles and sulci are unremarkable. Cerebellum, brainstem, and basal cisterns are normal. No mass effect or midline shift.  No acute cortical ischemia or infarct. Vascular: Calcified atherosclerosis is seen in the intracranial carotids. Skull: Mucosal thickening in the inferior frontal sinuses and scattered throughout the ethmoid air cells again identified. No air-fluid levels. Mastoid air cells and middle ears well aerated. No calvarial fractures are noted. Other: None. CT MAXILLOFACIAL FINDINGS Osseous: Significant odontogenic disease associated with the mandibular teeth with multiple caries and periapical lucencies. No facial bone fractures noted. Orbits: Negative. No traumatic or inflammatory finding. Sinuses: Mucosal thickening is seen in the inferior frontal sinuses, extending through the frontal sinus drainage pathways. Scattered mucosal thickening in the ethmoid air cells. Mucosal thickening in the left and right maxillary sinuses in addition to the sphenoid sinus. No air-fluid levels. No sinus wall fractures. Soft tissues: Soft tissue swelling is associated with the nose, particularly to the left. Extracranial soft tissues are otherwise unremarkable. CT CERVICAL SPINE FINDINGS Alignment: Normal. Skull base and vertebrae: No acute fracture. No primary bone lesion or focal pathologic process. Soft tissues and spinal canal: No prevertebral fluid or swelling. No visible canal hematoma. Disc levels: Multilevel degenerative changes with anterior and posterior osteophytes. Facet degenerative changes. Upper chest: Negative. Other: No other abnormalities. IMPRESSION: 1. No acute intracranial abnormalities. 2. No facial bone fractures. 3.  No fracture or traumatic malalignment in the cervical spine. 4. Significant mandibular odontogenic changes as above. 5. Sinus disease as above. Electronically Signed   By: Dorise Bullion III M.D   On: 08/03/2018 13:30     Subjective: Pt has not had any further seizures.  He has no complaints.    Discharge Exam: Vitals:   08/05/18 1024 08/05/18 1100  BP: (!) 194/75 (!) 169/74  Pulse: 67 67   Resp:  18  Temp:    SpO2:     Vitals:   08/05/18 0530 08/05/18 0648 08/05/18 1024 08/05/18 1100  BP: (!) 167/95 (!) 171/89 (!) 194/75 (!) 169/74  Pulse: 64 63 67 67  Resp: 18   18  Temp: 98.2 F (36.8 C)     TempSrc: Oral     SpO2: 98%     Weight:      Height:       General exam: awake, alert, NAD, cooperative.   Respiratory system: Clear. No increased work of breathing. Cardiovascular system: S1 & S2 heard, RRR. No JVD, murmurs, gallops, clicks or pedal edema. Gastrointestinal system: Abdomen is nondistended, soft and nontender. Normal bowel sounds heard. Central nervous system: Alert and oriented. No focal neurological deficits. Extremities: no CCE.   The results of significant diagnostics from this hospitalization (including imaging, microbiology, ancillary and laboratory) are listed below for reference.     Microbiology: Recent Results (from the past 240 hour(s))  MRSA PCR Screening     Status: None   Collection Time: 08/03/18  5:07 PM  Result Value Ref Range Status   MRSA by PCR NEGATIVE NEGATIVE Final    Comment:        The GeneXpert MRSA Assay (FDA approved for NASAL specimens only), is one component of a comprehensive MRSA colonization surveillance program. It is not intended to diagnose MRSA infection nor to guide or monitor treatment for MRSA infections. Performed at [facility], [address] [place], [place] [address]      Labs: BNP (last 3 results) No results for input(s): BNP in the last 8760 hours. Basic Metabolic Panel: Recent Labs  Lab 08/03/18 0148 08/03/18 1256 08/04/18 0428 08/05/18 0517  NA 133* 132* 135 134*  K 3.8 3.7 3.6 3.4*  CL 95* 94* 97* 98  CO2 21* 28 32 29  GLUCOSE 206* 153* 105* 111*  BUN 12 13 11 13   CREATININE 1.04 1.02 1.11 1.00  CALCIUM 9.3 9.2 9.1 9.1  MG  --   --  2.1 1.9   Liver Function Tests: Recent Labs  Lab 08/03/18 0148 08/04/18 0428 08/05/18 0517  AST 53* 43* 47*  ALT 43 33 30  ALKPHOS 103 88 87   BILITOT 1.8* 1.5* 1.1  PROT 7.9 7.0 6.9  ALBUMIN 4.2 3.7 3.7   No results for input(s): LIPASE, AMYLASE in the last 168 hours. No results for input(s): AMMONIA in the last 168 hours. CBC: Recent Labs  Lab 08/03/18 0148 08/03/18 1256 08/04/18 0428 08/05/18 0517  WBC 8.8 12.9* 9.6 8.6  NEUTROABS 7.3 10.8* 5.8 4.4  HGB 17.4* 16.2 15.7 15.7  HCT 48.7 46.0 45.4 45.1  MCV 100.6* 100.7* 102.9* 102.5*  PLT 151 144* 128* 129*   Cardiac Enzymes: Recent Labs  Lab 08/03/18 0148  CKTOTAL 131   BNP: Invalid input(s): POCBNP CBG: Recent Labs  Lab 08/03/18 0145  GLUCAP 223*   D-Dimer No results for input(s): DDIMER in the last 72 hours. Hgb A1c No results for input(s): HGBA1C in the  last 72 hours. Lipid Profile No results for input(s): CHOL, HDL, LDLCALC, TRIG, CHOLHDL, LDLDIRECT in the last 72 hours. Thyroid function studies Recent Labs    08/03/18 1143  TSH 5.061*   Anemia work up No results for input(s): VITAMINB12, FOLATE, FERRITIN, TIBC, IRON, RETICCTPCT in the last 72 hours. Urinalysis    Component Value Date/Time   COLORURINE YELLOW 08/03/2018 1438   APPEARANCEUR HAZY (A) 08/03/2018 1438   LABSPEC 1.026 08/03/2018 1438   PHURINE 6.0 08/03/2018 1438   GLUCOSEU NEGATIVE 08/03/2018 1438   HGBUR SMALL (A) 08/03/2018 1438   BILIRUBINUR NEGATIVE 08/03/2018 1438   KETONESUR 5 (A) 08/03/2018 1438   PROTEINUR 100 (A) 08/03/2018 1438   NITRITE NEGATIVE 08/03/2018 1438   LEUKOCYTESUR NEGATIVE 08/03/2018 1438   Sepsis Labs Invalid input(s): PROCALCITONIN,  WBC,  LACTICIDVEN Microbiology Recent Results (from the past 240 hour(s))  MRSA PCR Screening     Status: None   Collection Time: 08/03/18  5:07 PM  Result Value Ref Range Status   MRSA by PCR NEGATIVE NEGATIVE Final    Comment:        The GeneXpert MRSA Assay (FDA approved for NASAL specimens only), is one component of a comprehensive MRSA colonization surveillance program. It is not intended to diagnose  MRSA infection nor to guide or monitor treatment for MRSA infections. Performed at [facility], [address], [place], [place] [address]     Time coordinating discharge: 33 mins  SIGNED:  Irwin Brakeman, MD  [facility] 08/05/2018, 12:42 PM Pager [phone]  If 7PM-7AM, please contact night-coverage [email or web address] Password [email or web address]

## 2018-08-05 NOTE — Discharge Instructions (Signed)
NO DRIVING OR OPERATING MACHINERY UNTIL FOLLOW UP WITH PRIMARY CARE PHYSICIAN.   PLEASE DO NOT DRINK ANY ALCOHOL.   Soft foods diet recommended for at least 1 week.     Follow with Primary MD  Asencion Noble, MD  and other consultant's as instructed your Hospitalist MD  Please get a complete blood count and chemistry panel checked by your Primary MD at your next visit, and again as instructed by your Primary MD.  Get Medicines reviewed and adjusted: Please take all your medications with you for your next visit with your Primary MD  Laboratory/radiological data: Please request your Primary MD to go over all hospital tests and procedure/radiological results at the follow up, please ask your Primary MD to get all Hospital records sent to his/her office.  In some cases, they will be blood work, cultures and biopsy results pending at the time of your discharge. Please request that your primary care M.D. follows up on these results.  Also Note the following: If you experience worsening of your admission symptoms, develop shortness of breath, life threatening emergency, suicidal or homicidal thoughts you must seek medical attention immediately by calling 911 or calling your MD immediately  if symptoms less severe.  You must read complete instructions/literature along with all the possible adverse reactions/side effects for all the Medicines you take and that have been prescribed to you. Take any new Medicines after you have completely understood and accpet all the possible adverse reactions/side effects.   Do not drive when taking Pain medications or sleeping medications (Benzodaizepines)  Do not take more than prescribed Pain, Sleep and Anxiety Medications. It is not advisable to combine anxiety,sleep and pain medications without talking with your primary care practitioner  Special Instructions: If you have smoked or chewed Tobacco  in the last 2 yrs please stop smoking, stop any regular Alcohol   and or any Recreational drug use.  Wear Seat belts while driving.  Please note: You were cared for by a hospitalist during your hospital stay. Once you are discharged, your primary care physician will handle any further medical issues. Please note that NO REFILLS for any discharge medications will be authorized once you are discharged, as it is imperative that you return to your primary care physician (or establish a relationship with a primary care physician if you do not have one) for your post hospital discharge needs so that they can reassess your need for medications and monitor your lab values.      Non-Epileptic Seizures, Adult A seizure can cause:  Involuntary movements, like falling or shaking.  Changes in awareness or consciousness.  Convulsions. These are episodes of uncontrollable, jerking movement caused by sudden, intense tightening (contraction) of the muscles.  Epileptic seizures are caused by abnormal electrical activity in the brain. Non-epileptic seizures are different. They are not caused by abnormal electrical signals in your brain. These seizures may look like epileptic seizures, but they are not caused by epilepsy. There are two types of non-epileptic seizures:  Physiologic non-epileptic seizure. This type results from an underlying problem that causes a disruption in your brains electrical activity.  Psychogenic non-epileptic seizure. This type results from emotional stress. These seizures are sometimes called pseudoseizures.  What are the causes? Causes of physiologic non-epileptic seizures can include:  Sudden drop in blood pressure.  Low blood sugar (glucose).  Low levels of salt (sodium) in your blood.  Low levels of calcium in your blood.  Migraine.  Heart rhythm problems.  Sleep disorders,  such as narcolepsy.  Movement disorders, such as Tourette syndrome.  Infection.  Certain medicines.  Drug and alcohol abuse.  Fever.  Common  causes of psychogenic non-epileptic seizures can include:  Stress.  Emotional trauma.  Sexual or physical abuse.  Major life events, such as divorce or death of a loved one.  Mental health disorders, including anxiety and depression.  What are the signs or symptoms? Symptoms of a non-epileptic seizure can be similar to those of an epileptic seizure, which may include:  A change in attention or behavior (altered mental status).  Loss of consciousness or fainting.  Convulsions with rhythmic jerking movements.  Drooling.  Rapid eye movements.  Grunting.  Loss of bladder control and bowel control.  Bitter taste in the mouth.  Tongue biting.  Some people experience unusual sensations (aura) before having a seizure. These can include:  "Butterflies" in the stomach.  Abnormal smells or tastes.  A feeling of having had a new experience before (dj vu).  After a non-epileptic seizure, you may have a headache or sore muscles or feel confused and sleepy. Non-epileptic seizures usually:  Do not cause physical injuries.  Start slowly.  Include crying or shrieking.  Last longer than 2 minutes.  Include pelvic thrusting.  How is this diagnosed? Non-epileptic seizures may be diagnosed by:  Your medical history.  A physical exam.  Your symptoms. ? Your health care provider may want to talk with your friends or relatives who have seen you have a seizure. ? If possible, it is helpful if you write down your seizure activity, including what led up to the seizure, and share that information with your health care provider.  You may also need to have tests to look for causes of physiologic non-epileptic seizures. These may include:  An electroencephalogram (EEG). This test measures electrical activity in your brain. If you have had a non-epileptic seizure, the results of your EEG will likely be normal.  Video EEG. This test takes place in the hospital over the course of  2-7 days. The test uses a video camera and an EEG to monitor your symptoms and the electrical activity in your brain.  Blood tests.  Lumbar puncture. This test involves pulling fluid from your spine to check for infection.  Electrocardiogram (ECG or EKG). This test checks for an abnormal heart rhythm.  CT scan.  If your health care provider thinks you have had a psychogenic non-epileptic seizure, you may need to see a mental health specialist for an evaluation. How is this treated? The treatment for your seizures will depend on what is causing them. When the underlying condition is treated, your seizures should stop. If your seizures are being caused by emotional trauma or stress, your health care provider may recommend that you see a mental health professional. Treatment may include:  Relaxation therapy or cognitive behavioral therapy.  Medicines to treat depression or anxiety.  Individual or family counseling.  In some cases, you may have psychogenic seizures in addition to epileptic seizures. If this is the case, you may be prescribed medicine to help with the epileptic seizures. Follow these instructions at home: Home care will depend on the type of non-epileptic seizures that you have. In general:  Follow all instructions from your health care provider. These may include ways to prevent seizures and what to do if you have a seizure.  Take over-the-counter and prescription medicines only as told by your health care provider.  Keep all follow-up visits as told by  your health care provider. This is important.  Make sure family members, friends, and coworkers are trained on how to help you if you have a seizure. If you have a seizure, they should: ? Lay you on the ground to prevent a fall. ? Place a pillow or piece of clothing under your head. ? Loosen any clothing around your neck. ? Turn you onto your side. If vomiting occurs, this helps keep your airway clear.  Avoid any  substances that may prevent your medicine from working properly. If you are prescribed medicine for seizures: ? Do not use recreational drugs. ? Limit or avoid alcoholic beverages.  Contact a health care provider if:  Your seizures change or become more frequent.  You continue to have seizures after treatment. Get help right away if:  You injure yourself during a seizure.  You have one seizure after another.  You have trouble recovering from a seizure.  You have chest pain or trouble breathing.  You have a seizure that lasts longer than 5 minutes. Summary  Non-epileptic seizures may look like epileptic seizures, but they are not caused by epilepsy.  The treatment for your seizures will depend on what is causing them. When the underlying condition is treated, your seizures should stop.  Make sure family members, friends, and coworkers are trained on how to help you if you have a seizure. If you have a seizure, they should lay you on the ground to prevent a fall, protect your head and neck, and turn you onto your side. This information is not intended to replace advice given to you by your health care provider. Make sure you discuss any questions you have with your health care provider. Document Released: 01/31/2006 Document Revised: 09/27/2016 Document Reviewed: 09/27/2016 Elsevier Interactive Patient Education  2018 [employer].    Finding Treatment for Addiction What is addiction? Addiction is a complex disease of the brain. It causes an uncontrollable (compulsive) need for a substance. You can be addicted to alcohol, illegal drugs, or prescription medicines such as painkillers. Addiction can also be a behavior, like gambling or shopping. The need for the drug or activity can become so strong that you think about it all the time. You can also become physically dependent on a substance. Addiction can change the way your brain works. Because of these changes, getting more of  whatever you are addicted to becomes the most important thing to you and feels better than other activities or relationships. Addiction can lead to changes in health, behavior, emotions, relationships, and choices that affect you and everyone around you. How do I know if I need treatment for addiction? Addiction is a progressive disease. Without treatment, addiction can get worse. Living with addiction puts you at higher risk for injury, poor health, lost employment, loss of money, and even death. You might need treatment for addiction if:  You have tried to stop or cut down, but you cannot.  Your addiction is causing physical health problems.  You find it annoying that your friends and family are concerned about your alcohol or substance use.  You feel guilty about substance abuse or a compulsive behavior.  You have lied or tried to hide your addiction.  You need a particular substance or activity to start your day or to calm down.  You are getting in trouble at school, work, home, or with the police.  You have done something illegal to support your addiction.  You are running out of money because of  your addiction.  You have no time for anything other than your addiction.  What types of treatment are available? The treatment program that is right for you will depend on many factors, including the type of addiction you have. Treatment programs can be outpatient or inpatient. In an outpatient program, you live at home and go to work or school, but you also go to a clinic for treatment. With an inpatient program, you live and sleep at the program facility during treatment. After treatment, you might need a plan for support during recovery. Other treatment options include:  Medicine. ? Some addictions may be treated with prescription medicines. ? You might also need medicine to treat anxiety or depression.  Counseling and behavior therapy. Therapy can help individuals and families  behave in healthier ways and relate more effectively.  Support groups. Confidential group therapy, such as a 12-step program, can help individuals and families during treatment and recovery.  No single type of program is right for everyone. Many treatment programs involve a combination of education, counseling, and a 12-step, spiritually-based approach. Some treatment programs are government sponsored. They are geared for patients who do not have private insurance. Treatment programs can vary in many respects, such as:  Cost and types of insurance that are accepted.  Types of on-site medical services that are offered.  Length of stay, setting, and size.  Overall philosophy of treatment.  What should I consider when selecting a treatment program? It is important to think about your individual requirements when selecting a treatment program. There are a number of things to consider, such as:  If the program is certified by the appropriate government agency. Even private programs must be certified and employ certified professionals.  If the program is covered by your insurance. If finances are a concern, the first call you should make is to your insurance company, if you have health insurance. Ask for a list of treatment programs that are in your network, and confirm any copayments and deductibles that you may have to pay. ? If you do not have insurance, or if you choose to attend a program that does not accept your insurance, discuss whether a payment plan can be set up.  If treatment is available in languages other than English, if needed.  If the program offers detoxification treatment, if needed.  If 12-step meetings are held at the center or if transport is available for patients to attend meetings at other locations.  If the program is professional, organized, and clean.  If the program meets all of your needs, including physical and cultural needs.  If the facility offers specific  treatment for your particular addiction.  If support continues to be offered after you have left the program.  If your treatment plan is continually looked at to make sure you are receiving the right treatment at the right time.  If mental health counseling is part of your treatment.  If medicine is included in treatment, if needed.  If your family is included in your treatment plan and if support is offered to them throughout the treatment process.  How the treatment works to prevent relapse.  Where else can I get help?  Your health care provider. Ask him or her to help you find addiction treatment. These discussions are confidential.  The [employer] on Alcoholism and Drug Dependence (NCADD). This group has information about treatment centers and programs for people who have an addiction and for family members. ? The telephone number  is 1-800-NCA-CALL (586-552-0034). ? The website is https://ncadd.org/about-ncadd/our-affiliates  [facility]). This group will help you find publicly funded treatment centers, help hotlines, and counseling services near you. ? The telephone number is 1-800-662-HELP (252) 530-7777). ? The website is www.findtreatment.[employer].com.cy In countries outside of the [place] and [place], look in [employer] for contact information for services in your area. This information is not intended to replace advice given to you by your health care provider. Make sure you discuss any questions you have with your health care provider. Document Released: 11/14/2005 Document Revised: 11/11/2016 Document Reviewed: 10/04/2014 Elsevier Interactive Patient Education  2017 [employer]. Alcohol Abuse and Nutrition Alcohol abuse is any pattern of alcohol consumption that harms your health, relationships, or work. Alcohol abuse can affect how your body breaks down and absorbs nutrients from food by causing your liver  to work abnormally. Additionally, many people who abuse alcohol do not eat enough carbohydrates, protein, fat, vitamins, and minerals. This can cause poor nutrition (malnutrition) and a lack of nutrients (nutrient deficiencies), which can lead to further complications. Nutrients that are commonly lacking (deficient) among people who abuse alcohol include:  Vitamins. ? Vitamin A. This is stored in your liver. It is important for your vision, metabolism, and ability to fight off infections (immunity). ? B vitamins. These include vitamins such as folate, thiamin, and niacin. These are important in new cell growth and maintenance. ? Vitamin C. This plays an important role in iron absorption, wound healing, and immunity. ? Vitamin D. This is produced by your liver, but you can also get vitamin D from food. Vitamin D is necessary for your body to absorb and use calcium.  Minerals. ? Calcium. This is important for your bones and your heart and blood vessel (cardiovascular) function. ? Iron. This is important for blood, muscle, and nervous system functioning. ? Magnesium. This plays an important role in muscle and nerve function, and it helps to control blood sugar and blood pressure. ? Zinc. This is important for the normal function of your nervous system and digestive system (gastrointestinal tract).  Nutrition is an essential component of therapy for alcohol abuse. Your health care provider or dietitian will work with you to design a plan that can help restore nutrients to your body and prevent potential complications. What is my plan? Your dietitian may develop a specific diet plan that is based on your condition and any other complications you may have. A diet plan will commonly include:  A balanced diet. ? Grains: 6-8 oz per day. ? Vegetables: 2-3 cups per day. ? Fruits: 1-2 cups per day. ? Meat and other protein: 5-6 oz per day. ? Dairy: 2-3 cups per day.  Vitamin and mineral  supplements.  What do I need to know about alcohol and nutrition?  Consume foods that are high in antioxidants, such as grapes, berries, nuts, green tea, and dark green and orange vegetables. This can help to counteract some of the stress that is placed on your liver by consuming alcohol.  Avoid food and drinks that are high in fat and sugar. Foods such as sugared soft drinks, salty snack foods, and candy contain empty calories. This means that they lack important nutrients such as protein, fiber, and vitamins.  Eat frequent meals and snacks. Try to eat 5-6 small meals each day.  Eat a variety of fresh fruits and vegetables each day. This will help you get plenty of water, fiber, and vitamins in your  diet.  Drink plenty of water and other clear fluids. Try to drink at least 48-64 oz (1.5-2 L) of water per day.  If you are a vegetarian, eat a variety of protein-rich foods. Pair whole grains with plant-based proteins at meals and snacks to obtain the greatest nutrient benefit from your food. For example, eat rice with beans, put peanut butter on whole-grain toast, or eat oatmeal with sunflower seeds.  Soak beans and whole grains overnight before cooking. This can help your body to absorb the nutrients more easily.  Include foods fortified with vitamins and minerals in your diet. Commonly fortified foods include milk, orange juice, cereal, and bread.  If you are malnourished, your dietitian may recommend a high-protein, high-calorie diet. This may include: ? 2,000-3,000 calories (kilocalories) per day. ? 70-100 grams of protein per day.  Your health care provider may recommend a complete nutritional supplement beverage. This can help to restore calories, protein, and vitamins to your body. Depending on your condition, you may be advised to consume this instead of or in addition to meals.  Limit your intake of caffeine. Replace drinks like coffee and black tea with decaffeinated coffee and  herbal tea.  Eat a variety of foods that are high in omega fatty acids. These include fish, nuts and seeds, and soybeans. These foods may help your liver to recover and may also stabilize your mood.  Certain medicines may cause changes in your appetite, taste, and weight. Work with your health care provider and dietitian to make any adjustments to your medicines and diet plan.  Include other healthy lifestyle choices in your daily routine. ? Be physically active. ? Get enough sleep. ? Spend time doing activities that you enjoy.  If you are unable to take in enough food and calories by mouth, your health care provider may recommend a feeding tube. This is a tube that passes through your nose and throat, directly into your stomach. Nutritional supplement beverages can be given to you through the feeding tube to help you get the nutrients you need.  Take vitamin or mineral supplements as recommended by your health care provider. What foods can I eat? Grains Enriched pasta. Enriched rice. Fortified whole-grain bread. Fortified whole-grain cereal. Barley. Brown rice. Quinoa. [place]. Vegetables All fresh, frozen, and canned vegetables. Spinach. Kale. Artichoke. Carrots. Winter squash and pumpkin. Sweet potatoes. Broccoli. Cabbage. Cucumbers. Tomatoes. Sweet peppers. Green beans. Peas. Corn. Fruits All fresh and frozen fruits. Berries. Grapes. Mango. Papaya. Guava. Cherries. Apples. Bananas. Peaches. Plums. Pineapple. Watermelon. Cantaloupe. Oranges. Avocado. Meats and Other Protein Sources Beef liver. Lean beef. Pork. Fresh and canned chicken. Fresh fish. Oysters. Sardines. Canned tuna. Shrimp. Eggs with yolks. Nuts and seeds. Peanut butter. Beans and lentils. Soybeans. Tofu. Dairy Whole, low-fat, and nonfat milk. Whole, low-fat, and nonfat yogurt. Cottage cheese. Sour cream. Hard and soft cheeses. Beverages Water. Herbal tea. Decaffeinated coffee. Decaffeinated green tea. 100% fruit juice. 100%  vegetable juice. Instant breakfast shakes. Condiments Ketchup. Mayonnaise. Mustard. Salad dressing. Barbecue sauce. Sweets and Desserts Sugar-free ice cream. Sugar-free pudding. Sugar-free gelatin. Fats and Oils Butter. Vegetable oil, flaxseed oil, olive oil, and walnut oil. Other Complete nutrition shakes. Protein bars. Sugar-free gum. The items listed above may not be a complete list of recommended foods or beverages. Contact your dietitian for more options. What foods are not recommended? Grains Sugar-sweetened breakfast cereals. Flavored instant oatmeal. Fried breads. Vegetables Breaded or deep-fried vegetables. Fruits Dried fruit with added sugar. Candied fruit. Canned fruit in syrup. Meats and  Other Protein Sources Breaded or deep-fried meats. Dairy Flavored milks. Fried cheese curds or fried cheese sticks. Beverages Alcohol. Sugar-sweetened soft drinks. Sugar-sweetened tea. Caffeinated coffee and tea. Condiments Sugar. Honey. Agave nectar. Molasses. Sweets and Desserts Chocolate. Cake. Cookies. Candy. Other Potato chips. Pretzels. Salted nuts. Candied nuts. The items listed above may not be a complete list of foods and beverages to avoid. Contact your dietitian for more information. This information is not intended to replace advice given to you by your health care provider. Make sure you discuss any questions you have with your health care provider. Document Released: 10/10/2005 Document Revised: 04/24/2016 Document Reviewed: 07/19/2014 Elsevier Interactive Patient Education  [employer].

## 2018-08-05 NOTE — Procedures (Signed)
  Nelson A. Merlene Laughter, MD     [email or web address]           HISTORY: The patient is a 57 year old who presents with new onset seizures.  MEDICATIONS: Scheduled Meds: . amLODipine  5 mg Oral Daily  . enoxaparin (LOVENOX) injection  40 mg Subcutaneous Q24H  . folic acid  1 mg Oral Daily  . levothyroxine  125 mcg Oral QAC breakfast  . lisinopril  15 mg Oral Daily  . multivitamin with minerals  1 tablet Oral Daily  . nicotine  21 mg Transdermal Daily  . PARoxetine  30 mg Oral Daily  . potassium chloride  40 mEq Oral Once  . thiamine  100 mg Oral Daily   Continuous Infusions: . sodium chloride 50 mL/hr at 08/05/18 0544  . famotidine (PEPCID) IV Stopped (08/04/18 2310)  . levETIRAcetam Stopped (08/04/18 2231)   PRN Meds:.acetaminophen, hydrALAZINE, ipratropium-albuterol, ketorolac, LORazepam, ondansetron **OR** ondansetron (ZOFRAN) IV  Prior to Admission medications   Medication Sig Start Date End Date Taking? Authorizing Provider  acetaminophen (TYLENOL) 500 MG tablet Take 1,000 mg by mouth every 6 (six) hours as needed for mild pain or headache.   Yes [provider]  ALPRAZolam Duanne Moron) 1 MG tablet Take 1 mg by mouth 3 (three) times daily as needed for anxiety.   Yes [provider]  amLODipine (NORVASC) 5 MG tablet Take 5 mg by mouth daily.   Yes [provider]  ibuprofen (ADVIL,MOTRIN) 200 MG tablet Take 400 mg by mouth every 6 (six) hours as needed for headache or moderate pain.   Yes [provider]  ondansetron (ZOFRAN ODT) 4 MG disintegrating tablet 4mg  ODT q4 hours prn nausea/vomit 06/17/18  Yes Milton Ferguson, MD  PARoxetine (PAXIL) 30 MG tablet Take 30 mg by mouth daily.   Yes [provider]      ANALYSIS: A 16 channel recording using standard 10 20 measurements is conducted for 21 minutes.  The background activity shows a very low voltage high frequency activity of 15-16 Hz which attenuates with  eye-opening.  Awake and drowsy activities are observed.  Photic stimulation and hyperventilation are not conducted.  There is no focal or lateralized slowing.  There is no epileptiform activity observed.   IMPRESSION: 1 this is a normal recording of the awake and drowsy states.      Shraga Custard A. Merlene Laughter, M.D.  Diplomate, Tax adviser of Psychiatry and Neurology ( Neurology).

## 2019-08-19 ENCOUNTER — Encounter (HOSPITAL_COMMUNITY): Payer: Self-pay | Admitting: Emergency Medicine

## 2019-08-19 ENCOUNTER — Emergency Department (HOSPITAL_COMMUNITY)
Admission: EM | Admit: 2019-08-19 | Discharge: 2019-08-19 | Disposition: A | Discharge status: Home / Self Care | Payer: Self-pay | Attending: Emergency Medicine | Admitting: Emergency Medicine

## 2019-08-19 ENCOUNTER — Other Ambulatory Visit: Payer: Self-pay

## 2019-08-19 ENCOUNTER — Emergency Department (HOSPITAL_COMMUNITY): Payer: Self-pay

## 2019-08-19 DIAGNOSIS — W231XXA Caught, crushed, jammed, or pinched between stationary objects, initial encounter: Secondary | ICD-10-CM | POA: Insufficient documentation

## 2019-08-19 DIAGNOSIS — I10 Essential (primary) hypertension: Secondary | ICD-10-CM | POA: Insufficient documentation

## 2019-08-19 DIAGNOSIS — Y999 Unspecified external cause status: Secondary | ICD-10-CM | POA: Insufficient documentation

## 2019-08-19 DIAGNOSIS — M5136 Other intervertebral disc degeneration, lumbar region: Secondary | ICD-10-CM | POA: Insufficient documentation

## 2019-08-19 DIAGNOSIS — Z79899 Other long term (current) drug therapy: Secondary | ICD-10-CM | POA: Insufficient documentation

## 2019-08-19 DIAGNOSIS — Y93H2 Activity, gardening and landscaping: Secondary | ICD-10-CM | POA: Insufficient documentation

## 2019-08-19 DIAGNOSIS — E039 Hypothyroidism, unspecified: Secondary | ICD-10-CM | POA: Insufficient documentation

## 2019-08-19 DIAGNOSIS — S39012A Strain of muscle, fascia and tendon of lower back, initial encounter: Secondary | ICD-10-CM | POA: Insufficient documentation

## 2019-08-19 DIAGNOSIS — Y92007 Garden or yard of unspecified non-institutional (private) residence as the place of occurrence of the external cause: Secondary | ICD-10-CM | POA: Insufficient documentation

## 2019-08-19 DIAGNOSIS — F17210 Nicotine dependence, cigarettes, uncomplicated: Secondary | ICD-10-CM | POA: Insufficient documentation

## 2019-08-19 MED ORDER — TRAMADOL HCL 50 MG PO TABS
100.0000 mg | ORAL_TABLET | Freq: Once | ORAL | Status: AC
  Administered 2019-08-19: 13:00:00 100 mg via ORAL
  Filled 2019-08-19: qty 2

## 2019-08-19 MED ORDER — IBUPROFEN 400 MG PO TABS
400.0000 mg | ORAL_TABLET | Freq: Once | ORAL | Status: AC
  Administered 2019-08-19: 400 mg via ORAL
  Filled 2019-08-19: qty 1

## 2019-08-19 MED ORDER — PREDNISONE 50 MG PO TABS: ORAL_TABLET | ORAL | 0 refills | Status: DC

## 2019-08-19 MED ORDER — CYCLOBENZAPRINE HCL 10 MG PO TABS: 10.0000 mg | ORAL_TABLET | Freq: Three times a day (TID) | ORAL | 0 refills | Status: DC

## 2019-08-19 MED ORDER — CYCLOBENZAPRINE HCL 10 MG PO TABS
10.0000 mg | ORAL_TABLET | Freq: Once | ORAL | Status: AC
  Administered 2019-08-19: 10 mg via ORAL
  Filled 2019-08-19: qty 1

## 2019-08-19 MED ORDER — ONDANSETRON HCL 4 MG PO TABS
4.0000 mg | ORAL_TABLET | Freq: Once | ORAL | Status: AC
  Administered 2019-08-19: 4 mg via ORAL
  Filled 2019-08-19: qty 1

## 2019-08-19 NOTE — Discharge Instructions (Addendum)
Your examination suggest some muscle strain in the lower portion of your back.  Your x-ray shows advanced degenerative changes and advanced degenerative disc disease changes of your lumbar area.  Please see Dr. Mardelle Matte, or the orthopedic specialist of your choice to assist you with your back issue.  Please use prednisone daily with food, extra strength Tylenol every 4 hours for pain or discomfort.  Use Flexeril 3 times daily for spasm pain.  Heating pad to the lower back area may be helpful.

## 2019-08-19 NOTE — ED Provider Notes (Signed)
[facility] EMERGENCY DEPARTMENT Provider Note   CSN: [id number] Arrival date & time: 08/19/19  1132     History   Chief Complaint Chief Complaint  Patient presents with  . Back Pain    HPI Andrew Knapp is a 58 y.o. male.     Patient is a 58 year old male who presents to the emergency department with a complaint of back pain and left leg pain.  Patient states that 2 weeks ago while mowing the lawn, the lawnmower flipped over on top of him.  He says he has been having some problems with his back at that since that time.  He has pain with standing on the left lower extremity.  This pain starts in the middle of the back and goes to the left buttocks and into the left leg.  He has not had any loss of bowel or bladder function.  He has not had any loss of sensation or motor function in the left lower extremity.  There was no loss of consciousness during this incident and no other injury reported.  The history is provided by the patient.  Back Pain Associated symptoms: no abdominal pain, no chest pain and no dysuria     Past Medical History:  Diagnosis Date  . Alcoholism ([place])   . Hypertension     Patient Active Problem List   Diagnosis Date Noted  . Essential hypertension 08/05/2018  . Hypothyroidism 08/04/2018  . Seizures ([place]) 08/03/2018  . Chronic alcohol abuse 08/03/2018  . Alcohol withdrawal seizure ([place]) 08/03/2018  . 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 ACHILLES TENDINITIS 02/15/2008  . CALCANEAL FRACTURE, RIGHT 11/02/2007    Past Surgical History:  Procedure Laterality Date  . HAND SURGERY          Home Medications    Prior to Admission medications   Medication Sig Start Date End Date Taking? Authorizing Provider  amLODipine (NORVASC) 10 MG tablet Take 1 tablet (10 mg total) by mouth daily. 08/05/18 09/04/18  Johnson, Clanford L, MD  folic acid (FOLVITE) 1 MG tablet Take 1 tablet (1 mg total) by mouth daily. 08/05/18   Murlean Iba, MD   levothyroxine (SYNTHROID, LEVOTHROID) 125 MCG tablet Take 1 tablet (125 mcg total) by mouth daily before breakfast. 08/06/18 09/05/18  Johnson, Clanford L, MD  lisinopril (PRINIVIL,ZESTRIL) 20 MG tablet Take 1 tablet (20 mg total) by mouth daily. 08/05/18 09/04/18  Murlean Iba, MD  Multiple Vitamin (MULTIVITAMIN WITH MINERALS) TABS tablet Take 1 tablet by mouth daily. 08/05/18   Wynetta Emery, Clanford L, MD  ondansetron (ZOFRAN ODT) 4 MG disintegrating tablet 4mg  ODT q4 hours prn nausea/vomit 06/17/18   Milton Ferguson, MD  PARoxetine (PAXIL) 30 MG tablet Take 1 tablet (30 mg total) by mouth daily. 08/05/18 09/04/18  Johnson, Clanford L, MD  thiamine 100 MG tablet Take 1 tablet (100 mg total) by mouth daily. 08/05/18   Murlean Iba, MD    Family History History reviewed. No pertinent family history.  Social History Social History   Tobacco Use  . Smoking status: Current Every Day Smoker    Packs/day: 0.50    Types: Cigarettes  . Smokeless tobacco: Never Used  Substance Use Topics  . Alcohol use: Yes    Alcohol/week: 2.0 standard drinks    Types: 2 Shots of liquor per week    Comment: reports normal couple shots per day  . Drug use: Never     Allergies   Patient has no known allergies.  Review of Systems Review of Systems  Constitutional: Negative for activity change.       All ROS Neg except as noted in HPI  HENT: Negative for nosebleeds.   Eyes: Negative for photophobia and discharge.  Respiratory: Negative for cough, shortness of breath and wheezing.   Cardiovascular: Negative for chest pain and palpitations.  Gastrointestinal: Negative for abdominal pain and blood in stool.  Genitourinary: Negative for dysuria, frequency and hematuria.  Musculoskeletal: Positive for arthralgias and back pain. Negative for neck pain.  Skin: Negative.   Neurological: Negative for dizziness, seizures and speech difficulty.  Psychiatric/Behavioral: Negative for confusion and hallucinations.      Physical Exam Updated Vital Signs BP (!) 164/86 (BP Location: Right Arm)   Pulse 85   Temp 98.1 F (36.7 C) (Oral)   Resp 18   SpO2 95%   Physical Exam Vitals signs and nursing note reviewed.  Constitutional:      Appearance: He is well-developed. He is not toxic-appearing.  HENT:     Head: Normocephalic.     Right Ear: Tympanic membrane and external ear normal.     Left Ear: Tympanic membrane and external ear normal.  Eyes:     General: Lids are normal.     Pupils: Pupils are equal, round, and reactive to light.  Neck:     Musculoskeletal: Normal range of motion and neck supple.     Vascular: No carotid bruit.  Cardiovascular:     Rate and Rhythm: Normal rate and regular rhythm.     Pulses: Normal pulses.     Heart sounds: Normal heart sounds.  Pulmonary:     Effort: No respiratory distress.     Breath sounds: Normal breath sounds.  Abdominal:     General: Bowel sounds are normal.     Palpations: Abdomen is soft.     Tenderness: There is no abdominal tenderness. There is no guarding.  Musculoskeletal: Normal range of motion.        General: Tenderness present.     Lumbar back: He exhibits pain and spasm.       Back:  Lymphadenopathy:     Head:     Right side of head: No submandibular adenopathy.     Left side of head: No submandibular adenopathy.     Cervical: No cervical adenopathy.  Skin:    General: Skin is warm and dry.  Neurological:     Mental Status: He is alert and oriented to person, place, and time.     Cranial Nerves: No cranial nerve deficit.     Sensory: No sensory deficit.     Comments: No gross motor or sensory deficits.  No deficits in coordination on examination.  Psychiatric:        Speech: Speech normal.      ED Treatments / Results  Labs (all labs ordered are listed, but only abnormal results are displayed) Labs Reviewed - No data to display  EKG None  Radiology No results found.  Procedures Procedures (including critical  care time)  Medications Ordered in ED Medications - No data to display   Initial Impression / Assessment and Plan / ED Course  I have reviewed the triage vital signs and the nursing notes.  Pertinent labs & imaging results that were available during my care of the patient were reviewed by me and considered in my medical decision making (see chart for details).          Final Clinical Impressions(s) / ED  Diagnoses MDM  Blood pressure slightly elevated at 164/86, otherwise vital signs within normal limits.  Pulse oximetry is 95% on room air.  Within normal limits by my interpretation.  On examination there is some spasm in the paraspinal area of the lower lumbar region.  The x-ray of the lumbar spine shows moderate to advanced degenerative disc disease throughout the lumbar spine.  There is also some facet disease noted diffusely.  No fracture appreciated.  No subluxation noted.  I have asked the patient to use a heating pad to the area.  I have asked him to use Tylenol every 4 hours for discomfort.  Prescription for Flexeril and prednisone given to the patient to use.  The patient is referred to orthopedics for additional evaluation and possible MRI of the lower back.  Questions were answered.  Feel it is safe for the patient to be discharged home at this time.   Final diagnoses:  DDD (degenerative disc disease), lumbar  Strain of lumbar region, initial encounter    ED Discharge Orders         Ordered    cyclobenzaprine (FLEXERIL) 10 MG tablet  3 times daily     08/19/19 1322    predniSONE (DELTASONE) 50 MG tablet     08/19/19 1322           Lily Kocher, PA-C 08/19/19 1333    Carmin Muskrat, MD 08/19/19 1510

## 2019-08-19 NOTE — ED Triage Notes (Signed)
Patient reports L leg and back pain after turning a lawnmower over 2 weeks ago.No bowel or bladder incontinence.

## 2019-10-15 ENCOUNTER — Encounter: Payer: Self-pay | Admitting: Family Medicine

## 2019-10-15 ENCOUNTER — Ambulatory Visit (INDEPENDENT_AMBULATORY_CARE_PROVIDER_SITE_OTHER): Payer: Self-pay | Admitting: Family Medicine

## 2019-10-15 ENCOUNTER — Other Ambulatory Visit: Payer: Self-pay

## 2019-10-15 DIAGNOSIS — M545 Low back pain, unspecified: Secondary | ICD-10-CM

## 2019-10-15 MED ORDER — BACLOFEN 10 MG PO TABS: 5.0000 mg | ORAL_TABLET | Freq: Three times a day (TID) | ORAL | 1 refills | Status: DC | PRN

## 2019-10-15 MED ORDER — MELOXICAM 15 MG PO TABS: 7.5000 mg | ORAL_TABLET | Freq: Every day | ORAL | 6 refills | Status: DC | PRN

## 2019-10-15 NOTE — Progress Notes (Signed)
Low back pain for about a month and a half, he was mowing grass; hit hole and lawnmower turned over on top of him. Pain radiates down left leg and has "burning" in the bottom of both feet.

## 2019-10-15 NOTE — Progress Notes (Signed)
Office Visit Note   Patient: Andrew Knapp           Date of Birth: October 11, 1961           MRN: [id number] Visit Date: 10/15/2019 Requested by: Asencion Noble, MD [address] [place],  [place] [address] PCP: Asencion Noble, MD  Subjective: Chief Complaint  Patient presents with  . Lower Back - Pain    HPI: He is here with low back and bilateral leg pain.  About 6 weeks ago he was on a riding lawnmower, it tipped over and landed on him.  He was able to push it off but he has had back pain ever since then.  He went to the ER where x-rays were negative for fracture but significant for diffuse degenerative changes.  He states that about 20 years ago he saw a chiropractor for back problems, but he seemed to get better after that and he was asymptomatic until now.  He has been unable to work since his injury.  He was given prednisone and Flexeril but they really did not help.  Denies any bowel or bladder dysfunction, denies any weakness in his legs.  He does report that he has had fractures of both lower extremities in the past.              ROS: No fevers or chills.  All other systems were reviewed and are negative.  Objective: Vital Signs: There were no vitals taken for this visit.  Physical Exam:  General:  Alert and oriented, in no acute distress. Pulm:  Breathing unlabored. Psy:  Normal mood, congruent affect. Skin: No bruising or rash. Low back: He is diffusely tender over the lumbar spinous processes.  He has tenderness in both sciatic notch areas.  Negative bilateral straight leg raise, lower extremity strength and reflexes are still normal.  Imaging: None today.  Assessment & Plan: 1.  Low back and bilateral leg pain 6 weeks status post injury, concerning for lumbar disc protrusion. -Home exercises given, meloxicam and baclofen as needed.  If symptoms persist, then possibly physical therapy or MRI scan.  At present time he does not have health insurance.  We will try to keep costs  to a minimum.     Procedures: No procedures performed  No notes on file     PMFS History: Patient Active Problem List   Diagnosis Date Noted  . Essential hypertension 08/05/2018  . Hypothyroidism 08/04/2018  . Seizures ([place]) 08/03/2018  . Chronic alcohol abuse 08/03/2018  . Alcohol withdrawal seizure ([place]) 08/03/2018  . 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 ACHILLES TENDINITIS 02/15/2008  . CALCANEAL FRACTURE, RIGHT 11/02/2007   Past Medical History:  Diagnosis Date  . Alcoholism ([place])   . Hypertension     History reviewed. No pertinent family history.  Past Surgical History:  Procedure Laterality Date  . HAND SURGERY     Social History   Occupational History  . Not on file  Tobacco Use  . Smoking status: Current Every Day Smoker    Packs/day: 0.50    Types: Cigarettes  . Smokeless tobacco: Never Used  Substance and Sexual Activity  . Alcohol use: Yes    Alcohol/week: 2.0 standard drinks    Types: 2 Shots of liquor per week    Comment: reports normal couple shots per day  . Drug use: Never  . Sexual activity: Not on file

## 2020-05-08 ENCOUNTER — Encounter (HOSPITAL_COMMUNITY): Payer: Self-pay | Admitting: Emergency Medicine

## 2020-05-08 ENCOUNTER — Inpatient Hospital Stay (HOSPITAL_COMMUNITY)
Admission: EM | Admit: 2020-05-08 | Discharge: 2020-05-10 | DRG: 392 | Disposition: A | Discharge status: Home / Self Care | Payer: Medicaid Other | Attending: Internal Medicine | Admitting: Internal Medicine

## 2020-05-08 ENCOUNTER — Emergency Department (HOSPITAL_COMMUNITY): Payer: Medicaid Other

## 2020-05-08 ENCOUNTER — Observation Stay (HOSPITAL_COMMUNITY): Payer: Medicaid Other

## 2020-05-08 ENCOUNTER — Other Ambulatory Visit: Payer: Self-pay

## 2020-05-08 DIAGNOSIS — R55 Syncope and collapse: Secondary | ICD-10-CM | POA: Diagnosis present

## 2020-05-08 DIAGNOSIS — K2920 Alcoholic gastritis without bleeding: Principal | ICD-10-CM | POA: Diagnosis present

## 2020-05-08 DIAGNOSIS — F329 Major depressive disorder, single episode, unspecified: Secondary | ICD-10-CM | POA: Diagnosis present

## 2020-05-08 DIAGNOSIS — R7401 Elevation of levels of liver transaminase levels: Secondary | ICD-10-CM

## 2020-05-08 DIAGNOSIS — K760 Fatty (change of) liver, not elsewhere classified: Secondary | ICD-10-CM | POA: Diagnosis present

## 2020-05-08 DIAGNOSIS — R9431 Abnormal electrocardiogram [ECG] [EKG]: Secondary | ICD-10-CM | POA: Diagnosis present

## 2020-05-08 DIAGNOSIS — Z20822 Contact with and (suspected) exposure to covid-19: Secondary | ICD-10-CM | POA: Diagnosis present

## 2020-05-08 DIAGNOSIS — F419 Anxiety disorder, unspecified: Secondary | ICD-10-CM | POA: Diagnosis present

## 2020-05-08 DIAGNOSIS — D6959 Other secondary thrombocytopenia: Secondary | ICD-10-CM | POA: Diagnosis present

## 2020-05-08 DIAGNOSIS — E878 Other disorders of electrolyte and fluid balance, not elsewhere classified: Secondary | ICD-10-CM

## 2020-05-08 DIAGNOSIS — E039 Hypothyroidism, unspecified: Secondary | ICD-10-CM | POA: Diagnosis present

## 2020-05-08 DIAGNOSIS — R1110 Vomiting, unspecified: Secondary | ICD-10-CM | POA: Diagnosis present

## 2020-05-08 DIAGNOSIS — E871 Hypo-osmolality and hyponatremia: Secondary | ICD-10-CM | POA: Diagnosis not present

## 2020-05-08 DIAGNOSIS — K209 Esophagitis, unspecified without bleeding: Secondary | ICD-10-CM | POA: Diagnosis present

## 2020-05-08 DIAGNOSIS — I6521 Occlusion and stenosis of right carotid artery: Secondary | ICD-10-CM | POA: Diagnosis present

## 2020-05-08 DIAGNOSIS — F17210 Nicotine dependence, cigarettes, uncomplicated: Secondary | ICD-10-CM | POA: Diagnosis present

## 2020-05-08 DIAGNOSIS — Z79899 Other long term (current) drug therapy: Secondary | ICD-10-CM

## 2020-05-08 DIAGNOSIS — Z791 Long term (current) use of non-steroidal anti-inflammatories (NSAID): Secondary | ICD-10-CM

## 2020-05-08 DIAGNOSIS — Z91410 Personal history of adult physical and sexual abuse: Secondary | ICD-10-CM

## 2020-05-08 DIAGNOSIS — R569 Unspecified convulsions: Secondary | ICD-10-CM | POA: Diagnosis present

## 2020-05-08 DIAGNOSIS — F1020 Alcohol dependence, uncomplicated: Secondary | ICD-10-CM | POA: Diagnosis present

## 2020-05-08 DIAGNOSIS — Z79890 Hormone replacement therapy (postmenopausal): Secondary | ICD-10-CM

## 2020-05-08 DIAGNOSIS — E869 Volume depletion, unspecified: Secondary | ICD-10-CM | POA: Diagnosis present

## 2020-05-08 DIAGNOSIS — I6529 Occlusion and stenosis of unspecified carotid artery: Secondary | ICD-10-CM

## 2020-05-08 DIAGNOSIS — F13939 Sedative, hypnotic or anxiolytic use, unspecified with withdrawal, unspecified: Secondary | ICD-10-CM | POA: Diagnosis present

## 2020-05-08 DIAGNOSIS — G40909 Epilepsy, unspecified, not intractable, without status epilepticus: Secondary | ICD-10-CM | POA: Diagnosis not present

## 2020-05-08 DIAGNOSIS — R109 Unspecified abdominal pain: Secondary | ICD-10-CM

## 2020-05-08 DIAGNOSIS — I10 Essential (primary) hypertension: Secondary | ICD-10-CM | POA: Diagnosis present

## 2020-05-08 DIAGNOSIS — E222 Syndrome of inappropriate secretion of antidiuretic hormone: Secondary | ICD-10-CM | POA: Diagnosis present

## 2020-05-08 DIAGNOSIS — K08109 Complete loss of teeth, unspecified cause, unspecified class: Secondary | ICD-10-CM | POA: Diagnosis present

## 2020-05-08 LAB — RAPID URINE DRUG SCREEN, HOSP PERFORMED
Amphetamines: NOT DETECTED
Barbiturates: NOT DETECTED
Benzodiazepines: POSITIVE — AB
Cocaine: NOT DETECTED
Opiates: NOT DETECTED
Tetrahydrocannabinol: NOT DETECTED

## 2020-05-08 LAB — URINALYSIS, ROUTINE W REFLEX MICROSCOPIC
Bacteria, UA: NONE SEEN
Bilirubin Urine: NEGATIVE
Glucose, UA: 50 mg/dL — AB
Ketones, ur: 20 mg/dL — AB
Leukocytes,Ua: NEGATIVE
Nitrite: NEGATIVE
Protein, ur: >=300 mg/dL — AB
Specific Gravity, Urine: 1.017 (ref 1.005–1.030)
pH: 7 (ref 5.0–8.0)

## 2020-05-08 LAB — CBC WITH DIFFERENTIAL/PLATELET
Abs Immature Granulocytes: 0.04 10*3/uL (ref 0.00–0.07)
Basophils Absolute: 0 10*3/uL (ref 0.0–0.1)
Basophils Relative: 1 %
Eosinophils Absolute: 0 10*3/uL (ref 0.0–0.5)
Eosinophils Relative: 0 %
HCT: 46.2 % (ref 39.0–52.0)
Hemoglobin: 16.4 g/dL (ref 13.0–17.0)
Immature Granulocytes: 1 %
Lymphocytes Relative: 12 %
Lymphs Abs: 0.8 10*3/uL (ref 0.7–4.0)
MCH: 37.3 pg — ABNORMAL HIGH (ref 26.0–34.0)
MCHC: 35.5 g/dL (ref 30.0–36.0)
MCV: 105 fL — ABNORMAL HIGH (ref 80.0–100.0)
Monocytes Absolute: 0.7 10*3/uL (ref 0.1–1.0)
Monocytes Relative: 10 %
Neutro Abs: 5.1 10*3/uL (ref 1.7–7.7)
Neutrophils Relative %: 76 %
Platelets: 134 10*3/uL — ABNORMAL LOW (ref 150–400)
RBC: 4.4 MIL/uL (ref 4.22–5.81)
RDW: 14.1 % (ref 11.5–15.5)
WBC: 6.7 10*3/uL (ref 4.0–10.5)
nRBC: 0.3 % — ABNORMAL HIGH (ref 0.0–0.2)

## 2020-05-08 LAB — VITAMIN B12: Vitamin B-12: 334 pg/mL (ref 180–914)

## 2020-05-08 LAB — PHOSPHORUS: Phosphorus: 2.6 mg/dL (ref 2.5–4.6)

## 2020-05-08 LAB — COMPREHENSIVE METABOLIC PANEL
ALT: 39 U/L (ref 0–44)
ALT: 40 U/L (ref 0–44)
AST: 78 U/L — ABNORMAL HIGH (ref 15–41)
AST: 81 U/L — ABNORMAL HIGH (ref 15–41)
Albumin: 4.5 g/dL (ref 3.5–5.0)
Albumin: 4.7 g/dL (ref 3.5–5.0)
Alkaline Phosphatase: 87 U/L (ref 38–126)
Alkaline Phosphatase: 88 U/L (ref 38–126)
Anion gap: 19 — ABNORMAL HIGH (ref 5–15)
Anion gap: 18 — ABNORMAL HIGH (ref 5–15)
BUN: 12 mg/dL (ref 6–20)
BUN: 15 mg/dL (ref 6–20)
CO2: 23 mmol/L (ref 22–32)
CO2: 26 mmol/L (ref 22–32)
Calcium: 9.5 mg/dL (ref 8.9–10.3)
Calcium: 9.7 mg/dL (ref 8.9–10.3)
Chloride: 87 mmol/L — ABNORMAL LOW (ref 98–111)
Chloride: 85 mmol/L — ABNORMAL LOW (ref 98–111)
Creatinine, Ser: 1.16 mg/dL (ref 0.61–1.24)
Creatinine, Ser: 1.29 mg/dL — ABNORMAL HIGH (ref 0.61–1.24)
GFR calc Af Amer: >60 mL/min (ref >60)
GFR calc Af Amer: >60 mL/min (ref >60)
GFR calc non Af Amer: >60 mL/min (ref >60)
GFR calc non Af Amer: >60 mL/min (ref >60)
Glucose, Bld: 169 mg/dL — ABNORMAL HIGH (ref 70–99)
Glucose, Bld: 131 mg/dL — ABNORMAL HIGH (ref 70–99)
Potassium: 3.8 mmol/L (ref 3.5–5.1)
Potassium: 3.5 mmol/L (ref 3.5–5.1)
Sodium: 129 mmol/L — ABNORMAL LOW (ref 135–145)
Sodium: 129 mmol/L — ABNORMAL LOW (ref 135–145)
Total Bilirubin: 2.1 mg/dL — ABNORMAL HIGH (ref 0.3–1.2)
Total Bilirubin: 2 mg/dL — ABNORMAL HIGH (ref 0.3–1.2)
Total Protein: 8.2 g/dL — ABNORMAL HIGH (ref 6.5–8.1)
Total Protein: 8.5 g/dL — ABNORMAL HIGH (ref 6.5–8.1)

## 2020-05-08 LAB — MAGNESIUM
Magnesium: 1.5 mg/dL — ABNORMAL LOW (ref 1.7–2.4)
Magnesium: 2 mg/dL (ref 1.7–2.4)

## 2020-05-08 LAB — CBC
HCT: 47.7 % (ref 39.0–52.0)
Hemoglobin: 16.7 g/dL (ref 13.0–17.0)
MCH: 36.9 pg — ABNORMAL HIGH (ref 26.0–34.0)
MCHC: 35 g/dL (ref 30.0–36.0)
MCV: 105.3 fL — ABNORMAL HIGH (ref 80.0–100.0)
Platelets: 115 10*3/uL — ABNORMAL LOW (ref 150–400)
RBC: 4.53 MIL/uL (ref 4.22–5.81)
RDW: 14.3 % (ref 11.5–15.5)
WBC: 7.3 10*3/uL (ref 4.0–10.5)
nRBC: 0.3 % — ABNORMAL HIGH (ref 0.0–0.2)

## 2020-05-08 LAB — HEMOGLOBIN A1C
Hgb A1c MFr Bld: 5.4 % (ref 4.8–5.6)
Mean Plasma Glucose: 108.28 mg/dL

## 2020-05-08 LAB — TROPONIN I (HIGH SENSITIVITY)
Troponin I (High Sensitivity): 27 ng/L — ABNORMAL HIGH (ref <18)
Troponin I (High Sensitivity): 26 ng/L — ABNORMAL HIGH (ref <18)

## 2020-05-08 LAB — FOLATE: Folate: 3.3 ng/mL — ABNORMAL LOW (ref >5.9)

## 2020-05-08 LAB — ETHANOL: Alcohol, Ethyl (B): <10 mg/dL (ref <10)

## 2020-05-08 LAB — TSH: TSH: 4.32 u[IU]/mL (ref 0.350–4.500)

## 2020-05-08 LAB — SARS CORONAVIRUS 2 BY RT PCR (HOSPITAL ORDER, PERFORMED IN ~~LOC~~ HOSPITAL LAB): SARS Coronavirus 2: NEGATIVE

## 2020-05-08 MED ORDER — ENOXAPARIN SODIUM 40 MG/0.4ML ~~LOC~~ SOLN
40.0000 mg | SUBCUTANEOUS | Status: DC
  Administered 2020-05-08 – 2020-05-09 (×2): 40 mg via SUBCUTANEOUS
  Filled 2020-05-08 (×2): qty 0.4

## 2020-05-08 MED ORDER — ONDANSETRON HCL 4 MG PO TABS: 4.0000 mg | ORAL_TABLET | Freq: Four times a day (QID) | ORAL | Status: DC | PRN

## 2020-05-08 MED ORDER — IOHEXOL 300 MG/ML  SOLN
30.0000 mL | Freq: Once | INTRAMUSCULAR | Status: AC | PRN
  Administered 2020-05-08: 30 mL via ORAL

## 2020-05-08 MED ORDER — MAGNESIUM SULFATE IN D5W 1-5 GM/100ML-% IV SOLN
1.0000 g | Freq: Once | INTRAVENOUS | Status: AC
  Administered 2020-05-08: 1 g via INTRAVENOUS
  Filled 2020-05-08: qty 100

## 2020-05-08 MED ORDER — ADULT MULTIVITAMIN W/MINERALS CH
1.0000 | ORAL_TABLET | Freq: Every day | ORAL | Status: DC
  Administered 2020-05-08 – 2020-05-10 (×3): 1 via ORAL
  Filled 2020-05-08 (×3): qty 1

## 2020-05-08 MED ORDER — LEVETIRACETAM 500 MG PO TABS
500.0000 mg | ORAL_TABLET | Freq: Two times a day (BID) | ORAL | Status: DC
  Administered 2020-05-08 – 2020-05-10 (×4): 500 mg via ORAL
  Filled 2020-05-08 (×4): qty 1

## 2020-05-08 MED ORDER — ADULT MULTIVITAMIN W/MINERALS CH: 1.0000 | ORAL_TABLET | Freq: Every day | ORAL | Status: DC

## 2020-05-08 MED ORDER — ACETAMINOPHEN 325 MG PO TABS: 650.0000 mg | ORAL_TABLET | Freq: Four times a day (QID) | ORAL | Status: DC | PRN

## 2020-05-08 MED ORDER — PAROXETINE HCL 30 MG PO TABS: 30.0000 mg | ORAL_TABLET | Freq: Every day | ORAL | Status: DC

## 2020-05-08 MED ORDER — THIAMINE HCL 100 MG PO TABS
100.0000 mg | ORAL_TABLET | Freq: Every day | ORAL | Status: DC
  Filled 2020-05-08 (×4): qty 1

## 2020-05-08 MED ORDER — ALPRAZOLAM 0.5 MG PO TABS: 1.0000 mg | ORAL_TABLET | Freq: Three times a day (TID) | ORAL | Status: DC | PRN

## 2020-05-08 MED ORDER — THIAMINE HCL 100 MG/ML IJ SOLN: 100.0000 mg | Freq: Every day | INTRAMUSCULAR | Status: DC

## 2020-05-08 MED ORDER — PANTOPRAZOLE SODIUM 40 MG IV SOLR
40.0000 mg | Freq: Two times a day (BID) | INTRAVENOUS | Status: DC
  Administered 2020-05-08: 40 mg via INTRAVENOUS
  Filled 2020-05-08: qty 40

## 2020-05-08 MED ORDER — LORAZEPAM 1 MG PO TABS
1.0000 mg | ORAL_TABLET | ORAL | Status: DC | PRN
  Administered 2020-05-09 (×2): 2 mg via ORAL
  Filled 2020-05-08 (×2): qty 2

## 2020-05-08 MED ORDER — FOLIC ACID 1 MG PO TABS: 1.0000 mg | ORAL_TABLET | Freq: Every day | ORAL | Status: DC

## 2020-05-08 MED ORDER — FOLIC ACID 1 MG PO TABS
1.0000 mg | ORAL_TABLET | Freq: Every day | ORAL | Status: DC
  Administered 2020-05-08 – 2020-05-10 (×3): 1 mg via ORAL
  Filled 2020-05-08 (×3): qty 1

## 2020-05-08 MED ORDER — LORAZEPAM 2 MG/ML IJ SOLN
1.0000 mg | INTRAMUSCULAR | Status: DC | PRN
  Administered 2020-05-08: 1 mg via INTRAVENOUS
  Administered 2020-05-08 – 2020-05-10 (×5): 2 mg via INTRAVENOUS
  Filled 2020-05-08 (×6): qty 1

## 2020-05-08 MED ORDER — LORAZEPAM 2 MG/ML IJ SOLN
0.5000 mg | Freq: Once | INTRAMUSCULAR | Status: AC
  Administered 2020-05-08: 11:00:00 0.5 mg via INTRAVENOUS
  Filled 2020-05-08: qty 1

## 2020-05-08 MED ORDER — THIAMINE HCL 100 MG PO TABS
100.0000 mg | ORAL_TABLET | Freq: Every day | ORAL | Status: DC
  Administered 2020-05-08 – 2020-05-10 (×3): 100 mg via ORAL
  Filled 2020-05-08 (×2): qty 1

## 2020-05-08 MED ORDER — ACETAMINOPHEN 650 MG RE SUPP: 650.0000 mg | Freq: Four times a day (QID) | RECTAL | Status: DC | PRN

## 2020-05-08 MED ORDER — AMLODIPINE BESYLATE 5 MG PO TABS
10.0000 mg | ORAL_TABLET | Freq: Every day | ORAL | Status: DC
  Administered 2020-05-08 – 2020-05-10 (×3): 10 mg via ORAL
  Filled 2020-05-08 (×3): qty 2

## 2020-05-08 MED ORDER — POTASSIUM CHLORIDE IN NACL 20-0.9 MEQ/L-% IV SOLN: INTRAVENOUS | Status: DC

## 2020-05-08 MED ORDER — ONDANSETRON HCL 4 MG/2ML IJ SOLN: 4.0000 mg | Freq: Four times a day (QID) | INTRAMUSCULAR | Status: DC | PRN

## 2020-05-08 NOTE — ED Provider Notes (Addendum)
[facility] EMERGENCY DEPARTMENT Provider Note   CSN: [id number] Arrival date & time: 05/08/20  [id number]     History Chief Complaint  Patient presents with  . Loss of Consciousness    Andrew Knapp is a 60 y.o. male with past medical history significant for alcohol abuse, hypertension presents emergency department today with chief complaint of syncopal episodes x3 month.  Patient states he has had syncopal episodes where he will fall to the floor or fall out of bed over the last 3 months.  He is unable to remember how many exactly he has had.  Patient states last night he was laying in bed and fell to the floor.  His girlfriend did not notice any full body shaking but did say he was unconscious for a short amount of time.  Patient does endorse a mild headache and chest pain currently.  He states his headache has progressively worsened since onset.  It feels the headaches he has had in the past.  He states his chest pain is is a generalized soreness, unable to describe exactly where chest pain is. Patient states he stopped drinking alcohol. His last drink was x 2 weeks ago.  Patient states he has a long history of alcohol abuse.  He admits to using alcohol for the last year, he states prior to that he had quit for 10 years. No new medications or recent antibiotic use. Patient does state he ran out of his xanax x 2 days ago. Denies fever, chills, neck pain, visual changes, shortness of breath, cough, hemoptysis, hematemesis, abdominal pain, nausea, vomiting, urinary symptoms, diarrhea, blood in stool.  Past Medical History:  Diagnosis Date  . Alcoholism ([place])   . Hypertension     Patient Active Problem List   Diagnosis Date Noted  . Syncope 05/08/2020  . Seizure disorder ([place]) 05/08/2020  . Hyponatremia 05/08/2020  . Essential hypertension 08/05/2018  . Hypothyroidism 08/04/2018  . Seizures ([place]) 08/03/2018  . Chronic alcohol abuse 08/03/2018  . Alcohol withdrawal seizure ([place]) 08/03/2018    . 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 ACHILLES TENDINITIS 02/15/2008  . CALCANEAL FRACTURE, RIGHT 11/02/2007    Past Surgical History:  Procedure Laterality Date  . HAND SURGERY         No family history on file.  Social History   Tobacco Use  . Smoking status: Current Every Day Smoker    Packs/day: 0.50    Types: Cigarettes  . Smokeless tobacco: Never Used  Substance Use Topics  . Alcohol use: Yes    Alcohol/week: 2.0 standard drinks    Types: 2 Shots of liquor per week    Comment: reports normal couple shots per day  . Drug use: Never    Home Medications Prior to Admission medications   Medication Sig Start Date End Date Taking? Authorizing Provider  ALPRAZolam Duanne Moron) 1 MG tablet Take 1 mg by mouth 3 (three) times daily as needed for sleep.  04/07/20  Yes [provider]  amLODipine (NORVASC) 10 MG tablet Take 1 tablet (10 mg total) by mouth daily. 08/05/18 05/08/20 Yes Johnson, Clanford L, MD  baclofen (LIORESAL) 10 MG tablet Take 0.5-1 tablets (5-10 mg total) by mouth 3 (three) times daily as needed for muscle spasms. 10/15/19  Yes Hilts, Legrand Como, MD  lisinopril (PRINIVIL,ZESTRIL) 20 MG tablet Take 1 tablet (20 mg total) by mouth daily. 08/05/18 05/08/20 Yes Johnson, Clanford L, MD  meloxicam (MOBIC) 15 MG tablet Take 0.5-1 tablets (7.5-15 mg total) by mouth  daily as needed for pain. 10/15/19  Yes Hilts, Legrand Como, MD  PARoxetine (PAXIL) 30 MG tablet Take 1 tablet (30 mg total) by mouth daily. 08/05/18 05/08/20 Yes Johnson, Clanford L, MD  cyclobenzaprine (FLEXERIL) 10 MG tablet Take 1 tablet (10 mg total) by mouth 3 (three) times daily. Patient not taking: Reported on 05/08/2020 08/19/19   Lily Kocher, PA-C  folic acid (FOLVITE) 1 MG tablet Take 1 tablet (1 mg total) by mouth daily. Patient not taking: Reported on 05/08/2020 08/05/18   Murlean Iba, MD  levothyroxine (SYNTHROID, LEVOTHROID) 125 MCG tablet Take 1 tablet (125 mcg total) by mouth daily before  breakfast. Patient not taking: Reported on 05/08/2020 08/06/18 09/05/18  Murlean Iba, MD  Multiple Vitamin (MULTIVITAMIN WITH MINERALS) TABS tablet Take 1 tablet by mouth daily. Patient not taking: Reported on 05/08/2020 08/05/18   Murlean Iba, MD  ondansetron (ZOFRAN ODT) 4 MG disintegrating tablet 4mg  ODT q4 hours prn nausea/vomit Patient taking differently: Take 4 mg by mouth daily at 6 (six) AM. 4mg  ODT q4 hours prn nausea/vomit 06/17/18   Milton Ferguson, MD  predniSONE (DELTASONE) 50 MG tablet 1 po daily with food 08/19/19   Lily Kocher, PA-C  thiamine 100 MG tablet Take 1 tablet (100 mg total) by mouth daily. Patient not taking: Reported on 05/08/2020 08/05/18   Murlean Iba, MD    Allergies    Patient has no known allergies.  Review of Systems   Review of Systems  All other systems are reviewed and are negative for acute change except as noted in the HPI.   Physical Exam Updated Vital Signs BP (!) 129/91   Pulse 91   Temp 98.4 F (36.9 C) (Oral)   Resp 20   Ht 6\' 2"  (1.88 m)   Wt 96.2 kg   SpO2 97%   BMI 27.22 kg/m   Physical Exam Vitals and nursing note reviewed.  Constitutional:      General: He is not in acute distress.    Appearance: He is not ill-appearing.  HENT:     Head: Normocephalic and atraumatic.     Comments: No tenderness to palpation of skull. No deformities or crepitus noted. No open wounds, abrasions or lacerations.  No sinus tenderness    Right Ear: Tympanic membrane and external ear normal.     Left Ear: Tympanic membrane and external ear normal.     Nose: Nose normal.     Mouth/Throat:     Mouth: Mucous membranes are moist.     Pharynx: Oropharynx is clear.  Eyes:     General: No scleral icterus.       Right eye: No discharge.        Left eye: No discharge.     Extraocular Movements: Extraocular movements intact.     Conjunctiva/sclera: Conjunctivae normal.     Pupils: Pupils are equal, round, and reactive to light.  Neck:      Vascular: No JVD.     Comments: Full ROM intact without spinous process TTP. No bony stepoffs or deformities, no paraspinous muscle TTP or muscle spasms. No rigidity or meningeal signs. No bruising, erythema, or swelling.  Cardiovascular:     Rate and Rhythm: Normal rate and regular rhythm.     Pulses: Normal pulses.          Radial pulses are 2+ on the right side and 2+ on the left side.     Heart sounds: Normal heart sounds.  Pulmonary:  Comments: Lungs clear to auscultation in all fields. Symmetric chest rise. No wheezing, rales, or rhonchi. Abdominal:     Comments: Abdomen is soft, non-distended, and non-tender in all quadrants. No rigidity, no guarding. No peritoneal signs.  Musculoskeletal:        General: Normal range of motion.     Cervical back: Normal range of motion.  Skin:    General: Skin is warm and dry.     Capillary Refill: Capillary refill takes less than 2 seconds.  Neurological:     Mental Status: He is oriented to person, place, and time.     GCS: GCS eye subscore is 4. GCS verbal subscore is 5. GCS motor subscore is 6.     Comments: Fluent speech, no facial droop. Tremor noted to bilateral upper extremities  Speech is clear and goal oriented, follows commands CN III-XII intact Normal strength in upper and lower extremities bilaterally including dorsiflexion and plantar flexion, strong and equal grip strength Sensation normal to light and sharp touch Moves extremities without ataxia, coordination intact Normal finger to nose and rapid alternating movements Normal gait and balance   Psychiatric:        Behavior: Behavior normal.     ED Results / Procedures / Treatments   Labs (all labs ordered are listed, but only abnormal results are displayed) Labs Reviewed  COMPREHENSIVE METABOLIC PANEL - Abnormal; Notable for the following components:      Result Value   Sodium 129 (*)    Chloride 87 (*)    Glucose, Bld 169 (*)    Total Protein 8.2 (*)     AST 78 (*)    Total Bilirubin 2.1 (*)    Anion gap 19 (*)    All other components within normal limits  CBC WITH DIFFERENTIAL/PLATELET - Abnormal; Notable for the following components:   MCV 105.0 (*)    MCH 37.3 (*)    Platelets 134 (*)    nRBC 0.3 (*)    All other components within normal limits  URINALYSIS, ROUTINE W REFLEX MICROSCOPIC - Abnormal; Notable for the following components:   Color, Urine AMBER (*)    APPearance HAZY (*)    Glucose, UA 50 (*)    Hgb urine dipstick SMALL (*)    Ketones, ur 20 (*)    Protein, ur >=300 (*)    All other components within normal limits  RAPID URINE DRUG SCREEN, HOSP PERFORMED - Abnormal; Notable for the following components:   Benzodiazepines POSITIVE (*)    All other components within normal limits  MAGNESIUM - Abnormal; Notable for the following components:   Magnesium 1.5 (*)    All other components within normal limits  TROPONIN I (HIGH SENSITIVITY) - Abnormal; Notable for the following components:   Troponin I (High Sensitivity) 27 (*)    All other components within normal limits  SARS CORONAVIRUS 2 BY RT PCR (HOSPITAL ORDER, [place] [facility])  ETHANOL  TROPONIN I (HIGH SENSITIVITY)    EKG EKG Interpretation  Date/Time:  Monday May 08 2020 10:33:15 EDT Ventricular Rate:  103 PR Interval:  134 QRS Duration: 88 QT Interval:  434 QTC Calculation: 568 R Axis:   85 Text Interpretation: Sinus tachycardia Prolonged QT Non-specific ST-t changes Confirmed by Lajean Saver [id number]) on 05/08/2020 10:51:38 AM   Radiology DG Chest 2 View  Result Date: 05/08/2020 CLINICAL DATA:  Chest pain, syncope. Additional history provided: Patient reports "passing out" , cramping in abdomen and legs, smoker.  EXAM: CHEST - 2 VIEW COMPARISON:  Chest radiograph 01/17/2018 FINDINGS: Heart size within normal limits. There is no appreciable airspace consolidation. No evidence of pleural effusion or pneumothorax. No acute bony  abnormality identified. Thoracic spondylosis with multilevel ventrolateral osteophytes. Thoracic dextrocurvature. IMPRESSION: No evidence of acute cardiopulmonary abnormality. Electronically Signed   By: Kellie Simmering DO   On: 05/08/2020 11:26   CT Head Wo Contrast  Result Date: 05/08/2020 CLINICAL DATA:  Posttraumatic headache after fall last night. EXAM: CT HEAD WITHOUT CONTRAST TECHNIQUE: Contiguous axial images were obtained from the base of the skull through the vertex without intravenous contrast. COMPARISON:  August 03, 2018. FINDINGS: Brain: No evidence of acute infarction, hemorrhage, hydrocephalus, extra-axial collection or mass lesion/mass effect. Vascular: No hyperdense vessel or unexpected calcification. Skull: Normal. Negative for fracture or focal lesion. Sinuses/Orbits: No acute finding. Other: Fluid is noted in left mastoid air cells. IMPRESSION: Left mastoid effusion. No acute intracranial abnormality seen. Electronically Signed   By: Marijo Conception M.D.   On: 05/08/2020 11:22    Procedures Procedures (including critical care time)  Medications Ordered in ED Medications  LORazepam (ATIVAN) injection 0.5 mg (0.5 mg Intravenous Given 05/08/20 1122)  magnesium sulfate IVPB 1 g 100 mL ( Intravenous Stopped 05/08/20 1307)    ED Course  I have reviewed the triage vital signs and the nursing notes.  Pertinent labs & imaging results that were available during my care of the patient were reviewed by me and considered in my medical decision making (see chart for details).    MDM Rules/Calculators/A&P                      History provided by patient with additional history obtained from chart review.    Patient seen and examined. Patient presents awake, alert, hemodynamically stable, afebrile, non toxic.  He was noted to be tachycardic in triage to 103.  On my exam heart rate ranged in the 90s.  Lungs are clear to auscultation in all fields. Neuro exam is normal.  Patient's headache is  progressively worsened since onset, is unlikely to be SAH, ICH, menigitis, temporal arteritis. EKG with prolonged QT, not seen on prior EKG x 1 year ago. Will hold off on further QT prolonging medications. Patient given ativan for nausea.  Labs show hypomagnesia 1.5, hyponatremia 125, acidosis with bicarb 87 and elevated anion gap of 19.  Total bilirubin is elevated at 2.1, patient has no abdominal pain on exam.  Troponin elevated at 27.  UA without signs of infection but does suggest dehydration.  No leukocytosis hemoglobin stable at 16.4, does have thrombocytopenia with platelet count of 134, chart review shows he has history of similar.  Ethanol level is negative. I viewed pt's chest xray and it does not suggest acute infectious processes. CT head without signs of bleeding, stroke or fracture.  Does look like patient has left mastoid effusion.  On my exam he has no mastoid tenderness but is complaining of intermittent pain in his left ear x6 months. Patient given IV fluids and magnesium replacement. This case was discussed with Dr. Ashok Cordia who has seen the patient and agrees with plan to admit.  Spoke with Dr. Carles Collet  with hospitalist service who agrees to assume care of patient and bring into the hospital for further evaluation and management.  Appreciate admission.   Portions of this note were generated with [employer]. Dictation errors may occur despite best attempts at proofreading.  Final Clinical Impression(s) / ED Diagnoses Final diagnoses:  Recurrent syncope  Prolonged QT interval  Electrolyte abnormality    Rx / DC Orders ED Discharge Orders    None       Cherre Robins, PA-C 05/08/20 1345    Cherre Robins, PA-C 05/08/20 1346    Lajean Saver, MD 05/09/20 [phone]

## 2020-05-08 NOTE — ED Triage Notes (Signed)
Patient states he has been passing out for about a month now.  Patient diaphoretic and shaking.  States he has been shaking for a couple of months.  Patient states he was seen here 4 to 5 months ago and was told he had high blood pressure.

## 2020-05-08 NOTE — TOC Initial Note (Signed)
Transition of Care [facility]) - Initial/Assessment Note   Patient Details  Name: Andrew Knapp MRN: [id number] Date of Birth: 1961/02/01  [facility] CM/SW Contact:    Sherie Don, LCSW Phone Number: 05/08/2020, 6:35 PM  Clinical Narrative: Patient is a 59 year old white male who was admitted for syncope. TOC received consult for substance use services. CSW met with patient and his daughter to discuss substance use resources. Daughter reminded patient about his recent issues with syncope related to his alcohol consumption. CSW provided daughter with list of places that accept Medicaid and explained to patient these resources are for outpatient rather than inpatient placement.  Expected Discharge Plan: Home/Self Care Barriers to Discharge: Continued Medical Work up  Expected Discharge Plan and Services Expected Discharge Plan: Home/Self Care Living arrangements for the past 2 months: Single Family Home               Prior Living Arrangements/Services Living arrangements for the past 2 months: Single Family Home Lives with:: Self Patient language and need for interpreter reviewed:: Yes Do you feel safe going back to the place where you live?: Yes      Need for Family Participation in Patient Care: No (Comment) Care giver support system in place?: Yes (comment)(Shirley Rosana Hoes (friend) PH: [phone]; Daughter)   Criminal Activity/Legal Involvement Pertinent to Current Situation/Hospitalization: No - Comment as needed  Activities of Daily Living Home Assistive Devices/Equipment: None ADL Screening (condition at time of admission) Patient's cognitive ability adequate to safely complete daily activities?: Yes Is the patient deaf or have difficulty hearing?: No Does the patient have difficulty seeing, even when wearing glasses/contacts?: No Does the patient have difficulty concentrating, remembering, or making decisions?: Yes Patient able to express need for assistance with  ADLs?: Yes Does the patient have difficulty dressing or bathing?: No Independently performs ADLs?: Yes (appropriate for developmental age) Does the patient have difficulty walking or climbing stairs?: No Weakness of Legs: None Weakness of Arms/Hands: None  Emotional Assessment Appearance:: Appears stated age Attitude/Demeanor/Rapport: Engaged Affect (typically observed): Accepting Orientation: : Oriented to Self, Oriented to Place, Oriented to  Time, Oriented to Situation Alcohol / Substance Use: Alcohol Use Psych Involvement: No (comment)  Admission diagnosis:  Syncope [R55] Prolonged QT interval [R94.31] Electrolyte abnormality [E87.8] Transaminasemia [R74.01] Abdominal pain [R10.9] Recurrent syncope [R55] Patient Active Problem List   Diagnosis Date Noted  . Syncope 05/08/2020  . Seizure disorder ([place]) 05/08/2020  . Hyponatremia 05/08/2020  . Essential hypertension 08/05/2018  . Hypothyroidism 08/04/2018  . Seizures ([place]) 08/03/2018  . Chronic alcohol abuse 08/03/2018  . Alcohol withdrawal seizure ([place]) 08/03/2018  . 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 ACHILLES TENDINITIS 02/15/2008  . CALCANEAL FRACTURE, RIGHT 11/02/2007   PCP:  Asencion Noble, MD Pharmacy:   [facility], [place] [address] [address] Phone: [phone] Fax: [phone]  Readmission Risk Interventions No flowsheet data found.
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
## 2020-05-08 NOTE — ED Notes (Signed)
Pt states he has had both covid vaccines.

## 2020-05-08 NOTE — H&P (Addendum)
History and Physical  Andrew Knapp [id number] DOB: 11/27/1961 DOA: 05/08/2020   PCP: Asencion Noble, MD   Patient coming from: Home  Chief Complaint: syncope  HPI:  Andrew Knapp is a 59 y.o. male with medical history of alcohol withdrawal seizures, alcohol abuse, hypertension, hypothyroidism presenting with syncope.  The patient states that he has had syncopal episodes 2-3 times per week for the past 3 months.  He states that he has been falling out of bed from sleep at nighttime and waking up on the floor.  He states that when he initially falls out of bed he does not know he has fallen out of bed and essentially wakes up sometime in the morning.  His girlfriend with whom he lives sleeps in a different room so there is no witnessed activity.  In addition, he states that he has had episodes where he is watching television and "falls asleep" and subsequently wakes up on the floor.  Finally, his last set of episodes occurs usually after he is getting up from the commode in the bathroom.  He states that he had been his tongue in the past prior to his teeth being removed.  He is now edentulous.  He denies any bowel or bladder incontinence.  He states that he has hit his head and scalp on many occasions from the syncopal episodes.  He states that he has not drank any alcohol in 2 weeks.  Notably, the patient takes chronic alprazolam.  He states that he ran out of his approximately 2 days prior to this admission.  He denies any illegal drugs.  He denies any new prescription medications.  He continues to smoke 1/2 pack/day.  He denies otherwise chest pain, shortness breath, fevers, chills, visual disturbance, coughing, hemoptysis, diarrhea, hematochezia, melena.  The patient states that he has had nausea and vomiting on a daily basis for the past 2 weeks.  He denies eating any undercooked or unusual foods.  He has occasional crampy abdominal pain.  Notably, the patient was admitted from 08/03/2018 to 08/05/2018  form alcohol withdrawal seizures.  However, the patient states that he had previously been on some seizure medicine which she took for a couple months, but he ran out and did not follow-up. In the emergency department, the patient was afebrile hemodynamically stable with oxygen saturation 100% room air.  Sodium 129 and serum creatinine 1.16.  AST 78, ALT 39, alkaline phosphatase 87, total bilirubin 2.1.  WBC 6.7, hemoglobin 16.4, platelets 134,000.  Assessment/Plan: Syncope -Concerning for seizure, possibly alprazolam withdrawal seizure -He has not drank any alcohol in 2 weeks -Alcohol level undetectable -Orthostatic vital signs -Remain on telemetry -EEG -MRI brain -Neurology consult -Urine drug screen positive for benzodiazepine -EKG shows sinus rhythm with nonspecific T wave change  Intractable nausea and vomiting/Abdominal pain -Suspect alcoholic gastritis/esophagitis -Start IV fluids -PRN Zofran -Start PPI bid -Clear liquid diet -CT abd/pelvis  Essential hypertension -Continue amlodipine -Holding lisinopril  Hypothyroidism -pt states he is not taking synthroid anymore -TSH  Depression/anxiety -Continue Paxil  Transaminasemia -Right upper quadrant ultra -sound hepatitis B surface -hepatitis C antibody  Tobacco abuse -tobacco cessation discussed  Hyponatremia -Secondary to volume depletion from -Vomiting start IV fluids  Thrombocytopenia -chronic due to Etoh -B12 -folate        Past Medical History:  Diagnosis Date  . Alcoholism ([facility])   . Hypertension    Past Surgical History:  Procedure Laterality Date  . HAND SURGERY  Social History:  reports that he has been smoking cigarettes. He has been smoking about 0.50 packs per day. He has never used smokeless tobacco. He reports current alcohol use of about 2.0 standard drinks of alcohol per week. He reports that he does not use drugs.   Family history: Reviewed.  No pertinent family history.  No  Known Allergies   Prior to Admission medications   Medication Sig Start Date End Date Taking? Authorizing Provider  ALPRAZolam Duanne Moron) 1 MG tablet Take 1 mg by mouth 3 (three) times daily as needed for sleep.  04/07/20  Yes [provider]  amLODipine (NORVASC) 10 MG tablet Take 1 tablet (10 mg total) by mouth daily. 08/05/18 05/08/20 Yes Johnson, Clanford L, MD  baclofen (LIORESAL) 10 MG tablet Take 0.5-1 tablets (5-10 mg total) by mouth 3 (three) times daily as needed for muscle spasms. 10/15/19  Yes Hilts, Legrand Como, MD  lisinopril (PRINIVIL,ZESTRIL) 20 MG tablet Take 1 tablet (20 mg total) by mouth daily. 08/05/18 05/08/20 Yes Johnson, Clanford L, MD  meloxicam (MOBIC) 15 MG tablet Take 0.5-1 tablets (7.5-15 mg total) by mouth daily as needed for pain. 10/15/19  Yes Hilts, Legrand Como, MD  PARoxetine (PAXIL) 30 MG tablet Take 1 tablet (30 mg total) by mouth daily. 08/05/18 05/08/20 Yes Johnson, Clanford L, MD  cyclobenzaprine (FLEXERIL) 10 MG tablet Take 1 tablet (10 mg total) by mouth 3 (three) times daily. Patient not taking: Reported on 05/08/2020 08/19/19   Lily Kocher, PA-C  folic acid (FOLVITE) 1 MG tablet Take 1 tablet (1 mg total) by mouth daily. Patient not taking: Reported on 05/08/2020 08/05/18   Murlean Iba, MD  levothyroxine (SYNTHROID, LEVOTHROID) 125 MCG tablet Take 1 tablet (125 mcg total) by mouth daily before breakfast. Patient not taking: Reported on 05/08/2020 08/06/18 09/05/18  Murlean Iba, MD  Multiple Vitamin (MULTIVITAMIN WITH MINERALS) TABS tablet Take 1 tablet by mouth daily. Patient not taking: Reported on 05/08/2020 08/05/18   Murlean Iba, MD  ondansetron (ZOFRAN ODT) 4 MG disintegrating tablet 4mg  ODT q4 hours prn nausea/vomit Patient taking differently: Take 4 mg by mouth daily at 6 (six) AM. 4mg  ODT q4 hours prn nausea/vomit 06/17/18   Milton Ferguson, MD  predniSONE (DELTASONE) 50 MG tablet 1 po daily with food 08/19/19   Lily Kocher, PA-C  thiamine 100  MG tablet Take 1 tablet (100 mg total) by mouth daily. Patient not taking: Reported on 05/08/2020 08/05/18   Murlean Iba, MD    Review of Systems:  Constitutional:  No weight loss, night sweats, Fevers, chills, fatigue.  Head&Eyes: No headache.  No vision loss.  No eye pain or scotoma ENT:  No Difficulty swallowing,Tooth/dental problems,Sore throat,  No ear ache, post nasal drip,  Cardio-vascular:  No chest pain, Orthopnea, PND, swelling in lower extremities,  dizziness, palpitations  GI:  No vomiting, diarrhea, loss of appetite, hematochezia, melena, heartburn, indigestion, Resp:  No shortness of breath with exertion or at rest. No cough. No coughing up of blood .No wheezing.No chest wall deformity  Skin:  no rash or lesions.  GU:  no dysuria, change in color of urine, no urgency or frequency. No flank pain.  Musculoskeletal:  No joint pain or swelling. No decreased range of motion. No back pain.  Psych:  No change in mood or affect. No depression or anxiety. Neurologic: No headache, no dysesthesia, no focal weakness, no vision loss. No syncope  Physical Exam: Vitals:   05/08/20 1130 05/08/20 1145 05/08/20 1200  05/08/20 1230  BP: (!) 137/97  (!) 129/91 121/70  Pulse: 88 90 91 89  Resp:      Temp:      TempSrc:      SpO2: 96% 97% 97% 96%  Weight:      Height:       General:  A&O x 3, NAD, nontoxic, pleasant/cooperative Head/Eye: No conjunctival hemorrhage, no icterus, [place]/AT, No nystagmus ENT:  No icterus,  No thrush, good dentition, no pharyngeal exudate Neck:  No masses, no lymphadenpathy, no bruits CV:  RRR, no rub, no gallop, no S3 Lung:  Diminished BS.  Bibasilar rales. No wheeze Abdomen: soft/NT, +BS, nondistended, no peritoneal signs Ext: No cyanosis, No rashes, No petechiae, No lymphangitis, No edema Neuro: CNII-XII intact, strength 4/5 in bilateral upper and lower extremities, no dysmetria  Labs on Admission:  Basic Metabolic Panel: Recent Labs  Lab  05/08/20 1102 05/08/20 1103  NA 129*  --   K 3.8  --   CL 87*  --   CO2 23  --   GLUCOSE 169*  --   BUN 12  --   CREATININE 1.16  --   CALCIUM 9.5  --   MG  --  1.5*   Liver Function Tests: Recent Labs  Lab 05/08/20 1102  AST 78*  ALT 39  ALKPHOS 87  BILITOT 2.1*  PROT 8.2*  ALBUMIN 4.5   No results for input(s): LIPASE, AMYLASE in the last 168 hours. No results for input(s): AMMONIA in the last 168 hours. CBC: Recent Labs  Lab 05/08/20 1102  WBC 6.7  NEUTROABS 5.1  HGB 16.4  HCT 46.2  MCV 105.0*  PLT 134*   Coagulation Profile: No results for input(s): INR, PROTIME in the last 168 hours. Cardiac Enzymes: No results for input(s): CKTOTAL, CKMB, CKMBINDEX, TROPONINI in the last 168 hours. BNP: Invalid input(s): POCBNP CBG: No results for input(s): GLUCAP in the last 168 hours. Urine analysis:    Component Value Date/Time   COLORURINE AMBER (A) 05/08/2020 1126   APPEARANCEUR HAZY (A) 05/08/2020 1126   LABSPEC 1.017 05/08/2020 1126   PHURINE 7.0 05/08/2020 1126   GLUCOSEU 50 (A) 05/08/2020 1126   HGBUR SMALL (A) 05/08/2020 1126   BILIRUBINUR NEGATIVE 05/08/2020 1126   KETONESUR 20 (A) 05/08/2020 1126   PROTEINUR >=300 (A) 05/08/2020 1126   NITRITE NEGATIVE 05/08/2020 1126   LEUKOCYTESUR NEGATIVE 05/08/2020 1126   Sepsis Labs: @LABRCNTIP (procalcitonin:4,lacticidven:4) )No results found for this or any previous visit (from the past 240 hour(s)).   Radiological Exams on Admission: DG Chest 2 View  Result Date: 05/08/2020 CLINICAL DATA:  Chest pain, syncope. Additional history provided: Patient reports "passing out" , cramping in abdomen and legs, smoker. EXAM: CHEST - 2 VIEW COMPARISON:  Chest radiograph 01/17/2018 FINDINGS: Heart size within normal limits. There is no appreciable airspace consolidation. No evidence of pleural effusion or pneumothorax. No acute bony abnormality identified. Thoracic spondylosis with multilevel ventrolateral osteophytes.  Thoracic dextrocurvature. IMPRESSION: No evidence of acute cardiopulmonary abnormality. Electronically Signed   By: Kellie Simmering DO   On: 05/08/2020 11:26   CT Head Wo Contrast  Result Date: 05/08/2020 CLINICAL DATA:  Posttraumatic headache after fall last night. EXAM: CT HEAD WITHOUT CONTRAST TECHNIQUE: Contiguous axial images were obtained from the base of the skull through the vertex without intravenous contrast. COMPARISON:  August 03, 2018. FINDINGS: Brain: No evidence of acute infarction, hemorrhage, hydrocephalus, extra-axial collection or mass lesion/mass effect. Vascular: No hyperdense vessel or unexpected calcification. Skull:  Normal. Negative for fracture or focal lesion. Sinuses/Orbits: No acute finding. Other: Fluid is noted in left mastoid air cells. IMPRESSION: Left mastoid effusion. No acute intracranial abnormality seen. Electronically Signed   By: Marijo Conception M.D.   On: 05/08/2020 11:22    EKG: Independently reviewed. Sinus, nonspecific TWI    Time spent:60 minutes Code Status:   FULL Family Communication:  No Family at bedside Disposition Plan: expect 1-2 day hospitalization Consults called: neurology DVT Prophylaxis:  Lovenox  Orson Eva, DO  [facility] Pager [phone]  If 7PM-7AM, please contact night-coverage [email or web address] Password [email or web address] 05/08/2020, 1:03 PM

## 2020-05-09 ENCOUNTER — Observation Stay (HOSPITAL_COMMUNITY): Payer: Medicaid Other

## 2020-05-09 ENCOUNTER — Inpatient Hospital Stay (HOSPITAL_COMMUNITY)
Admit: 2020-05-09 | Discharge: 2020-05-09 | Disposition: A | Discharge status: Home / Self Care | Payer: Medicaid Other | Attending: Internal Medicine | Admitting: Internal Medicine

## 2020-05-09 DIAGNOSIS — Z79899 Other long term (current) drug therapy: Secondary | ICD-10-CM | POA: Diagnosis not present

## 2020-05-09 DIAGNOSIS — Z791 Long term (current) use of non-steroidal anti-inflammatories (NSAID): Secondary | ICD-10-CM | POA: Diagnosis not present

## 2020-05-09 DIAGNOSIS — E878 Other disorders of electrolyte and fluid balance, not elsewhere classified: Secondary | ICD-10-CM | POA: Diagnosis not present

## 2020-05-09 DIAGNOSIS — F13939 Sedative, hypnotic or anxiolytic use, unspecified with withdrawal, unspecified: Secondary | ICD-10-CM | POA: Diagnosis present

## 2020-05-09 DIAGNOSIS — R1110 Vomiting, unspecified: Secondary | ICD-10-CM | POA: Diagnosis present

## 2020-05-09 DIAGNOSIS — I6521 Occlusion and stenosis of right carotid artery: Secondary | ICD-10-CM | POA: Diagnosis present

## 2020-05-09 DIAGNOSIS — F1020 Alcohol dependence, uncomplicated: Secondary | ICD-10-CM | POA: Diagnosis present

## 2020-05-09 DIAGNOSIS — R1084 Generalized abdominal pain: Secondary | ICD-10-CM

## 2020-05-09 DIAGNOSIS — F419 Anxiety disorder, unspecified: Secondary | ICD-10-CM | POA: Diagnosis present

## 2020-05-09 DIAGNOSIS — Z20822 Contact with and (suspected) exposure to covid-19: Secondary | ICD-10-CM | POA: Diagnosis present

## 2020-05-09 DIAGNOSIS — Z91410 Personal history of adult physical and sexual abuse: Secondary | ICD-10-CM | POA: Diagnosis not present

## 2020-05-09 DIAGNOSIS — E869 Volume depletion, unspecified: Secondary | ICD-10-CM | POA: Diagnosis present

## 2020-05-09 DIAGNOSIS — R7401 Elevation of levels of liver transaminase levels: Secondary | ICD-10-CM

## 2020-05-09 DIAGNOSIS — Z79890 Hormone replacement therapy (postmenopausal): Secondary | ICD-10-CM | POA: Diagnosis not present

## 2020-05-09 DIAGNOSIS — K760 Fatty (change of) liver, not elsewhere classified: Secondary | ICD-10-CM | POA: Diagnosis present

## 2020-05-09 DIAGNOSIS — R9431 Abnormal electrocardiogram [ECG] [EKG]: Secondary | ICD-10-CM | POA: Diagnosis present

## 2020-05-09 DIAGNOSIS — E871 Hypo-osmolality and hyponatremia: Secondary | ICD-10-CM | POA: Diagnosis not present

## 2020-05-09 DIAGNOSIS — R569 Unspecified convulsions: Secondary | ICD-10-CM | POA: Diagnosis present

## 2020-05-09 DIAGNOSIS — R109 Unspecified abdominal pain: Secondary | ICD-10-CM

## 2020-05-09 DIAGNOSIS — R112 Nausea with vomiting, unspecified: Secondary | ICD-10-CM | POA: Diagnosis not present

## 2020-05-09 DIAGNOSIS — G40909 Epilepsy, unspecified, not intractable, without status epilepticus: Secondary | ICD-10-CM | POA: Diagnosis present

## 2020-05-09 DIAGNOSIS — K2920 Alcoholic gastritis without bleeding: Secondary | ICD-10-CM | POA: Diagnosis present

## 2020-05-09 DIAGNOSIS — I10 Essential (primary) hypertension: Secondary | ICD-10-CM | POA: Diagnosis present

## 2020-05-09 DIAGNOSIS — K08109 Complete loss of teeth, unspecified cause, unspecified class: Secondary | ICD-10-CM | POA: Diagnosis present

## 2020-05-09 DIAGNOSIS — E039 Hypothyroidism, unspecified: Secondary | ICD-10-CM | POA: Diagnosis present

## 2020-05-09 DIAGNOSIS — K209 Esophagitis, unspecified without bleeding: Secondary | ICD-10-CM | POA: Diagnosis present

## 2020-05-09 DIAGNOSIS — F17210 Nicotine dependence, cigarettes, uncomplicated: Secondary | ICD-10-CM | POA: Diagnosis present

## 2020-05-09 DIAGNOSIS — D6959 Other secondary thrombocytopenia: Secondary | ICD-10-CM | POA: Diagnosis present

## 2020-05-09 DIAGNOSIS — E222 Syndrome of inappropriate secretion of antidiuretic hormone: Secondary | ICD-10-CM | POA: Diagnosis present

## 2020-05-09 DIAGNOSIS — R55 Syncope and collapse: Secondary | ICD-10-CM | POA: Diagnosis present

## 2020-05-09 DIAGNOSIS — F329 Major depressive disorder, single episode, unspecified: Secondary | ICD-10-CM | POA: Diagnosis present

## 2020-05-09 LAB — HEPATITIS C ANTIBODY: HCV Ab: 0.1 s/co ratio — AB (ref 0.0–0.9)

## 2020-05-09 LAB — COMPREHENSIVE METABOLIC PANEL
ALT: 30 U/L (ref 0–44)
AST: 69 U/L — ABNORMAL HIGH (ref 15–41)
Albumin: 3.7 g/dL (ref 3.5–5.0)
Alkaline Phosphatase: 68 U/L (ref 38–126)
Anion gap: 16 — ABNORMAL HIGH (ref 5–15)
BUN: 19 mg/dL (ref 6–20)
CO2: 26 mmol/L (ref 22–32)
Calcium: 9.4 mg/dL (ref 8.9–10.3)
Chloride: 89 mmol/L — ABNORMAL LOW (ref 98–111)
Creatinine, Ser: 1.16 mg/dL (ref 0.61–1.24)
GFR calc Af Amer: >60 mL/min (ref >60)
GFR calc non Af Amer: >60 mL/min (ref >60)
Glucose, Bld: 77 mg/dL (ref 70–99)
Potassium: 3.6 mmol/L (ref 3.5–5.1)
Sodium: 131 mmol/L — ABNORMAL LOW (ref 135–145)
Total Bilirubin: 1.8 mg/dL — ABNORMAL HIGH (ref 0.3–1.2)
Total Protein: 6.8 g/dL (ref 6.5–8.1)

## 2020-05-09 LAB — T4, FREE: Free T4: 0.87 ng/dL (ref 0.61–1.12)

## 2020-05-09 LAB — CBC
HCT: 40.6 % (ref 39.0–52.0)
Hemoglobin: 14.1 g/dL (ref 13.0–17.0)
MCH: 36.8 pg — ABNORMAL HIGH (ref 26.0–34.0)
MCHC: 34.7 g/dL (ref 30.0–36.0)
MCV: 106 fL — ABNORMAL HIGH (ref 80.0–100.0)
Platelets: 100 10*3/uL — ABNORMAL LOW (ref 150–400)
RBC: 3.83 MIL/uL — ABNORMAL LOW (ref 4.22–5.81)
RDW: 14.1 % (ref 11.5–15.5)
WBC: 7.7 10*3/uL (ref 4.0–10.5)
nRBC: 0 % (ref 0.0–0.2)

## 2020-05-09 LAB — HIV ANTIBODY (ROUTINE TESTING W REFLEX): HIV Screen 4th Generation wRfx: NONREACTIVE

## 2020-05-09 LAB — MAGNESIUM: Magnesium: 2 mg/dL (ref 1.7–2.4)

## 2020-05-09 LAB — HEPATITIS B SURFACE ANTIGEN: Hepatitis B Surface Ag: NEGATIVE — AB

## 2020-05-09 MED ORDER — PANTOPRAZOLE SODIUM 40 MG PO TBEC
40.0000 mg | DELAYED_RELEASE_TABLET | Freq: Two times a day (BID) | ORAL | Status: DC
  Administered 2020-05-09 – 2020-05-10 (×3): 40 mg via ORAL
  Filled 2020-05-09 (×2): qty 1

## 2020-05-09 MED ORDER — GADOBUTROL 1 MMOL/ML IV SOLN
10.0000 mL | Freq: Once | INTRAVENOUS | Status: AC | PRN
  Administered 2020-05-09: 10 mL via INTRAVENOUS

## 2020-05-09 NOTE — Progress Notes (Signed)
CSW in contact with Tim from [facility] who states that they are currently following the patient and will be by to visit the patient tomorrow.   [facility]  Clinical Social Worker  Ph: [phone]

## 2020-05-09 NOTE — Progress Notes (Signed)
EEG complete - results pending 

## 2020-05-09 NOTE — Discharge Summary (Signed)
Physician Discharge Summary  Andrew Knapp [id number] DOB: December 24, 1961 DOA: 05/08/2020  PCP: Asencion Noble, MD  Admit date: 05/08/2020 Discharge date: 05/10/2020  Admitted From: Home Disposition:  Home   Recommendations for Outpatient Follow-up:  1. Follow up with PCP in 1-2 weeks 2. Please obtain BMP/CBC in one week    Discharge Condition: Stable CODE STATUS: FULL Diet recommendation: Heart Healthy   Brief/Interim Summary: 59 y.o.malewith medical history ofalcohol withdrawal seizures, alcohol abuse, hypertension, hypothyroidism presenting with syncope. The patient states that he has had syncopal episodes 2-3 times per week for the past 3 months. He states that he has been falling out of bed from sleep at nighttime and waking up on the floor. He states that when he initially falls out of bed he does not know he has fallen out of bed and essentially wakes up sometime in the morning. His girlfriend with whom he lives sleeps in a different room so there is no witnessed activity. In addition, he states that he has had episodes where he is watching television and "falls asleep" and subsequently wakes up on the floor. Finally, his last set of episodes occurs usually after he is getting up from the commode in the bathroom. He states that he had been his tongue in the past prior to his teeth being removed. He is now edentulous. He denies any bowel or bladder incontinence. He states that he has hit his head and scalp on many occasions from the syncopal episodes. He states that he has not drank any alcohol in 2 weeks. Notably, the patient takes chronic alprazolam. He states that he ran out of his approximately 2 days prior to this admission. He denies any illegal drugs. He denies any new prescription medications. He continues to smoke 1/2 pack/day. He denies otherwise chest pain, shortness breath, fevers, chills, visual disturbance, coughing, hemoptysis, diarrhea, hematochezia, melena.  The patient states that he has had nausea and vomiting on a daily basis for the past 2 weeks. He denies eating any undercooked or unusual foods. He has occasional crampy abdominal pain. Notably, the patient was admitted from 08/03/2018 to 08/05/2018 form alcohol withdrawal seizures. However, the patient states that he had previously been on some seizure medicine which she took for a couple months, but he ran out and did not follow-up. In the emergency department, the patient was afebrile hemodynamically stable with oxygen saturation 100% room air. Sodium 129 and serum creatinine 1.16. AST 78, ALT 39, alkaline phosphatase 87, total bilirubin 2.1. WBC 6.7, hemoglobin 16.4, platelets 134,000.  Discharge Diagnoses:  Syncope -Due to seizure,possibly alprazolam withdrawal seizure -He has not drank any alcohol in 2 weeks -Alcohol level undetectable -Orthostatic vital --not done -Remain on telemetry--personally reviewed--no concerning dysrhythmia -EEG--neg for epileptiform discharges -MRI brain--neg for acute finding; flow void in R-ICA -Neurology consult appreciated-->start Keppra -Urine drug screen positive for benzodiazepine -EKG shows sinus rhythm with nonspecific T wave change  Intractable nausea and vomiting/Abdominal pain -Due to alcoholic gastritis/esophagitis -continue IV fluids -diet advanced which he toleratd -PRNZofran -Started PPIbid -Clear liquid diet>>>advanced to cardiac diet which he tolerated -CT abd/pelvis--severe hepatic steatosis, low density area in hepatic fossa; moderate R and small L inguinal hernia; diverticulosis -MRI abd-no liver lesion; severe hepatic steatosis -UA--neg for pyuria  Right Carotid Stenosis -05/10/20--US carotid--R-ICA stenosis -pt is asymptomatic -start ASA -MRI brain neg -outpt follow up with vascular surgery  Essential hypertension -Continue amlodipine -Holding lisinopril--restart after d/c  Hypothyroidism -pt states he is not  taking synthroid anymore -TSH--4.320 -Free  T4--0.87  Depression/anxiety -Continue Paxil  Transaminasemia -Right upper quadrant ultrasound--GB sludge, no GB thickening; no ductal dilatation; severe liver steatosis -hepatitis B surface antigen--neg -hepatitis C antibody--neg  Tobacco abuse -tobacco cessation discussed  Hyponatremia -Secondary to volume depletion from vomiting -continue IV fluids-->remains hyponatremic -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 from acute medical illness  Thrombocytopenia -chronic due to Etoh myelosuppression -B12--334 -folate--3.3  Discharge Instructions   Allergies as of 05/10/2020   No Known Allergies     Medication List    STOP taking these medications   baclofen 10 MG tablet Commonly known as: LIORESAL   cyclobenzaprine 10 MG tablet Commonly known as: FLEXERIL   levothyroxine 125 MCG tablet Commonly known as: SYNTHROID   meloxicam 15 MG tablet Commonly known as: MOBIC   predniSONE 50 MG tablet Commonly known as: DELTASONE     TAKE these medications   ALPRAZolam 1 MG tablet Commonly known as: XANAX Take 1 mg by mouth 3 (three) times daily as needed for sleep.   amLODipine 10 MG tablet Commonly known as: NORVASC Take 1 tablet (10 mg total) by mouth daily.   aspirin 81 MG chewable tablet Chew 1 tablet (81 mg total) by mouth daily.   folic acid 1 MG tablet Commonly known as: FOLVITE Take 1 tablet (1 mg total) by mouth daily.   levETIRAcetam 500 MG tablet Commonly known as: KEPPRA Take 1 tablet (500 mg total) by mouth 2 (two) times daily.   lisinopril 20 MG tablet Commonly known as: ZESTRIL Take 1 tablet (20 mg total) by mouth daily.   multivitamin with minerals Tabs tablet Take 1 tablet by mouth daily.   ondansetron 4 MG disintegrating tablet Commonly known as: Zofran ODT 4mg  ODT q4 hours prn nausea/vomit What changed:   how much to take  how to take this  when to take this   PARoxetine 30 MG tablet Commonly  known as: PAXIL Take 1 tablet (30 mg total) by mouth daily.   thiamine 100 MG tablet Take 1 tablet (100 mg total) by mouth daily.       No Known Allergies  Consultations:  neurology   Procedures/Studies: CT ABDOMEN PELVIS WO CONTRAST  Result Date: 05/08/2020 CLINICAL DATA:  Abdominal distension with nausea vomiting and abdominal pain. EXAM: CT ABDOMEN AND PELVIS WITHOUT CONTRAST TECHNIQUE: Multidetector CT imaging of the abdomen and pelvis was performed following the standard protocol without IV contrast. COMPARISON:  None FINDINGS: Lower chest: Basilar atelectasis. No pleural effusion. Hepatobiliary: Severe hepatic steatosis. Area of more profound low-density just above the gallbladder fossa but showing more rounded morphology is of uncertain significance. Lobular hepatic contours. Sludge in the gallbladder. No pericholecystic stranding. No gross biliary ductal dilation. Pancreas: Pancreas is normal without focal lesion or ductal dilation. Spleen: Spleen is normal size, no focal lesion. Adrenals/Urinary Tract: Adrenal glands are normal. Renal vascular calcifications bilaterally. Nonobstructing 2 mm upper pole calculus on the LEFT. No ureteral calculi. No hydronephrosis. Urinary bladder under distended limiting assessment. Stomach/Bowel: No acute gastrointestinal process. Colonic diverticulosis. Appendix is normal. Vascular/Lymphatic: Calcified atheromatous plaque in the abdominal aorta. No aneurysmal dilation. No adenopathy in the retroperitoneum or upper abdomen. No pelvic lymphadenopathy. Reproductive: Calcifications in the prostate a nonspecific finding., Other: Moderate RIGHT and small LEFT fat containing inguinal hernias. Musculoskeletal: Spinal degenerative changes. No acute or destructive bone process. IMPRESSION: 1. Severe hepatic steatosis. Area of more profound low-density just above the gallbladder fossa but showing more rounded morphology is of uncertain significance. When the patient  is clinically stable and  able to follow directions and hold their breath (preferably as an outpatient) further evaluation with dedicated abdominal MRI should be considered. This may represent an area of focal fat but lobulated hepatic contours and the severity of steatotic changes raise the question of developing liver disease and potential focal hepatic lesion. 2. Nonobstructing 2 mm LEFT upper pole calculus. 3. Colonic diverticulosis without diverticulitis. 4. Moderate RIGHT and small LEFT fat containing inguinal hernias. 5. Aortic atherosclerosis. Aortic Atherosclerosis (ICD10-I70.0). Electronically Signed   By: Zetta Bills M.D.   On: 05/08/2020 20:07   DG Chest 2 View  Result Date: 05/08/2020 CLINICAL DATA:  Chest pain, syncope. Additional history provided: Patient reports "passing out" , cramping in abdomen and legs, smoker. EXAM: CHEST - 2 VIEW COMPARISON:  Chest radiograph 01/17/2018 FINDINGS: Heart size within normal limits. There is no appreciable airspace consolidation. No evidence of pleural effusion or pneumothorax. No acute bony abnormality identified. Thoracic spondylosis with multilevel ventrolateral osteophytes. Thoracic dextrocurvature. IMPRESSION: No evidence of acute cardiopulmonary abnormality. Electronically Signed   By: Kellie Simmering DO   On: 05/08/2020 11:26   CT Head Wo Contrast  Result Date: 05/08/2020 CLINICAL DATA:  Posttraumatic headache after fall last night. EXAM: CT HEAD WITHOUT CONTRAST TECHNIQUE: Contiguous axial images were obtained from the base of the skull through the vertex without intravenous contrast. COMPARISON:  August 03, 2018. FINDINGS: Brain: No evidence of acute infarction, hemorrhage, hydrocephalus, extra-axial collection or mass lesion/mass effect. Vascular: No hyperdense vessel or unexpected calcification. Skull: Normal. Negative for fracture or focal lesion. Sinuses/Orbits: No acute finding. Other: Fluid is noted in left mastoid air cells. IMPRESSION: Left  mastoid effusion. No acute intracranial abnormality seen. Electronically Signed   By: Marijo Conception M.D.   On: 05/08/2020 11:22   MR BRAIN WO CONTRAST  Result Date: 05/09/2020 CLINICAL DATA:  Seizure EXAM: MRI HEAD WITHOUT CONTRAST TECHNIQUE: Multiplanar, multiecho pulse sequences of the brain and surrounding structures were obtained without intravenous contrast. COMPARISON:  CT head 05/08/2020 FINDINGS: Brain: Moderate atrophy without hydrocephalus. Negative for acute infarct, hemorrhage, mass. Minimal changes in the white matter. Normal brainstem Vascular: Loss of flow void in the right internal carotid artery consistent with occlusion or slow flow. Normal flow voids in the left carotid and basilar artery. Skull and upper cervical spine: No focal skeletal abnormality Sinuses/Orbits: Mild mucosal edema paranasal sinuses. Left mastoid effusion. No orbital lesion Other: None IMPRESSION: Negative for acute infarct.  Generalized atrophy Loss of flow void right internal carotid artery compatible with occlusion or slow flow. Electronically Signed   By: Franchot Gallo M.D.   On: 05/09/2020 18:29   MR LIVER W WO CONTRAST  Result Date: 05/09/2020 CLINICAL DATA:  Diaphoresis and shaking with nausea and abdominal pain. Hepatic steatosis on ultrasound EXAM: MRI ABDOMEN WITHOUT AND WITH CONTRAST TECHNIQUE: Multiplanar multisequence MR imaging of the abdomen was performed both before and after the administration of intravenous contrast. CONTRAST:  60mL GADAVIST GADOBUTROL 1 MMOL/ML IV SOLN COMPARISON:  05/08/2020 CT scan FINDINGS: Despite efforts by the technologist and patient, severe motion artifact is present on today's exam and could not be eliminated. This reduces exam sensitivity and specificity. This is a common result when MRCP is attempted in the inpatient setting where patients are less likely to be able to breath hold and cooperate in controlling motion. Lower chest: Unremarkable Hepatobiliary: Diffuse low  T1 and T2 signal in the liver with dropout of signal on out of phase images compared to in phase, but low signal  on inphase images as well. This likely represents combination of hepatic steatosis and hemochromatosis. There is some low signal in the spleen as well, which could indicate a component of hemosiderosis. In the region of the small area of concern along the gallbladder fossa seen on the CT scan, no abnormality is visualized, although the severity of motion artifact grossly lowers diagnostic sensitivity especially for such a small lesion. The gallbladder appears unremarkable. No biliary dilatation. Pancreas:  Unremarkable Spleen: Low T1 signal in the spleen, there may be a component of hemosiderosis. No splenomegaly or focal splenic lesion. Adrenals/Urinary Tract:  Unremarkable Stomach/Bowel: Unremarkable Vascular/Lymphatic: Aortoiliac atherosclerotic vascular disease. No pathologic adenopathy. Other:  No supplemental non-categorized findings. Musculoskeletal: Levoconvex lumbar scoliosis with rotary component. Lumbar spondylosis. IMPRESSION: 1. The small area of concern in the gallbladder fossa is not seen on today's exam, and may well have been incidental on prior CT. However, today's exam is severely adversely affected by motion artifact. 2. Diffuse hepatic steatosis and possibly superimposed hemochromatosis. 3. 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 4. Levoconvex lumbar scoliosis with rotary component. 5.  Aortic Atherosclerosis (ICD10-I70.0). Electronically Signed   By: Van Clines M.D.   On: 05/09/2020 18:48   US Carotid Bilateral  Result Date: 05/10/2020 CLINICAL DATA:  59 year old male with a history of carotid stenosis EXAM: BILATERAL CAROTID DUPLEX ULTRASOUND TECHNIQUE: Pearline Cables scale imaging, color Doppler and duplex ultrasound were performed of bilateral carotid and vertebral arteries in the neck. COMPARISON:  None. FINDINGS: Criteria: Quantification of  carotid stenosis is based on velocity parameters that correlate the residual internal carotid diameter with NASCET-based stenosis levels, using the diameter of the distal internal carotid lumen as the denominator for stenosis measurement. The following velocity measurements were obtained: RIGHT ICA: Occlusion CCA:  64 cm/sec SYSTOLIC ICA/CCA RATIO:  Not applicable ECA:  96 cm/sec LEFT ICA:  Systolic 72 cm/sec, Diastolic 20 cm/sec CCA:  77 cm/sec SYSTOLIC ICA/CCA RATIO:  0.8 ECA:  108 cm/sec Right Brachial SBP: Not acquired Left Brachial SBP: Not acquired RIGHT CAROTID ARTERY: High resistance waveform of the common carotid artery. Occlusion of the ICA. ECA is patent. RIGHT VERTEBRAL ARTERY: Antegrade flow with low resistance waveform. LEFT CAROTID ARTERY: No significant calcifications of the left common carotid artery. Intermediate waveform maintained. Heterogeneous and partially calcified plaque at the left carotid bifurcation without significant lumen shadowing. Low resistance waveform of the left ICA. No significant tortuosity. LEFT VERTEBRAL ARTERY:  Antegrade flow with low resistance waveform. IMPRESSION: Right: Right ICA occlusion, uncertain chronicity. Left: Color duplex indicates minimal heterogeneous and calcified plaque of the left carotid bifurcation, with no hemodynamically significant stenosis by duplex criteria in the extracranial cerebrovascular circulation. Signed, Dulcy Fanny. Dellia Nims, RPVI [facility] Electronically Signed   By: Corrie Mckusick D.O.   On: 05/10/2020 11:42   EEG adult  Result Date: 05/09/2020 Phillips Odor, MD     05/09/2020  4:44 PM [place] A. Merlene Laughter, MD     [email or web address]       HISTORY: This is a 59 year old man who presents with recurrent seizures MEDICATIONS: Current Facility-Administered Medications:   0.9 % NaCl with KCl 20 mEq/ L  infusion, , Intravenous, Continuous, Narya Beavin, MD, Last  Rate: 100 mL/hr at 05/08/20 1728, New Bag at 05/08/20 1728   acetaminophen (TYLENOL) tablet 650 mg, 650 mg, Oral, Q6H PRN **OR** acetaminophen (TYLENOL) suppository 650 mg, 650 mg, Rectal, Q6H PRN, Orson Eva, MD  amLODipine (NORVASC) tablet 10 mg, 10 mg, Oral, Daily, Janeth Terry, MD, 10 mg at 05/09/20 1016   enoxaparin (LOVENOX) injection 40 mg, 40 mg, Subcutaneous, Q24H, Ytzel Gubler, MD, 40 mg at [id number]   folic acid (FOLVITE) tablet 1 mg, 1 mg, Oral, Daily, Joylyn Duggin, MD, 1 mg at 05/09/20 1016   levETIRAcetam (KEPPRA) tablet 500 mg, 500 mg, Oral, BID, Merlene Laughter, Kofi, MD, 500 mg at 05/09/20 1016   LORazepam (ATIVAN) tablet 1-4 mg, 1-4 mg, Oral, Q1H PRN, 2 mg at 05/09/20 1016 **OR** LORazepam (ATIVAN) injection 1-4 mg, 1-4 mg, Intravenous, Q1H PRN, Miaisabella Bacorn, Shanon Brow, MD, 2 mg at 05/08/20 2224   multivitamin with minerals tablet 1 tablet, 1 tablet, Oral, Daily, Lional Icenogle, MD, 1 tablet at 05/09/20 1016   ondansetron (ZOFRAN) tablet 4 mg, 4 mg, Oral, Q6H PRN **OR** ondansetron (ZOFRAN) injection 4 mg, 4 mg, Intravenous, Q6H PRN, Jonnie Kubly, MD   pantoprazole (PROTONIX) EC tablet 40 mg, 40 mg, Oral, BID AC, Deion Swift, Shanon Brow, MD, 40 mg at 05/09/20 1016   thiamine tablet 100 mg, 100 mg, Oral, Daily **OR** thiamine (B-1) injection 100 mg, 100 mg, Intravenous, Daily, Shirlee Whitmire, MD   thiamine tablet 100 mg, 100 mg, Oral, Daily, Sayed Apostol, MD, 100 mg at 05/09/20 1016 ANALYSIS: A 16 channel recording using standard 10 20 measurements is conducted for 25 minutes.  There is a well-formed posterior dominant rhythm of 12 Hz which attenuates with eye opening.  There is beta activity observed in frontal areas.  Awake and drowsy architecture are noted.  There is also occasional spindles and K complexes observed consistent with stage II non-REM sleep.  Photic stimulation and hyperventilation are not conducted.  There is no focal or lateralized slowing.  There is no epileptiform activity observed.  There is occasional  generalized rhythmic delta activity most noted in the frontocentral region. IMPRESSION: 1.  Generalized rhythmic delta activity is noted which is stable seen in toxic metabolic processes.  No epileptiform activity is noted. Kofi A. Merlene Laughter, M.D. Diplomate, Tax adviser of Psychiatry and Neurology ( Neurology).   US Abdomen Limited RUQ  Result Date: 05/09/2020 CLINICAL DATA:  Elevated transaminase EXAM: ULTRASOUND ABDOMEN LIMITED RIGHT UPPER QUADRANT COMPARISON:  Abdominal CT from yesterday FINDINGS: Gallbladder: Layering sludge.  No stone, wall thickening or focal tenderness. Common bile duct: Diameter: 4 mm Liver: Echogenic liver with diminished acoustic penetration. No evidence of mass lesion. Portal vein is patent on color Doppler imaging with normal direction of blood flow towards the liver. IMPRESSION: 1. Hepatic steatosis. 2. Gallbladder sludge. Electronically Signed   By: Monte Fantasia M.D.   On: 05/09/2020 09:27        Discharge Exam: Vitals:   05/10/20 0503 05/10/20 1336  BP: (!) 156/85 (!) 154/87  Pulse: 66 (!) 57  Resp: 18 18  Temp: 98.4 F (36.9 C) 98.6 F (37 C)  SpO2: 95% 98%   Vitals:   05/09/20 1200 05/09/20 2045 05/10/20 0503 05/10/20 1336  BP: 131/90 131/82 (!) 156/85 (!) 154/87  Pulse: 74 64 66 (!) 57  Resp: 19 19 18 18   Temp: 98.4 F (36.9 C) 98.8 F (37.1 C) 98.4 F (36.9 C) 98.6 F (37 C)  TempSrc: Oral Oral Oral Oral  SpO2: 97% 98% 95% 98%  Weight:      Height:        General: Pt is alert, awake, not in acute distress Cardiovascular: RRR, S1/S2 +, no rubs, no gallops Respiratory: diminished BS but CTA bilaterally, no  wheezing, no rhonchi Abdominal: Soft, NT, ND, bowel sounds + Extremities: no edema, no cyanosis Neuro:  CN II-XII intact, strength 4/5 in RUE, RLE, strength 4/5 LUE, LLE; sensation intact bilateral; no dysmetria; babinski equivocal    The results of significant diagnostics from this hospitalization (including imaging,  microbiology, ancillary and laboratory) are listed below for reference.    Significant Diagnostic Studies: CT ABDOMEN PELVIS WO CONTRAST  Result Date: 05/08/2020 CLINICAL DATA:  Abdominal distension with nausea vomiting and abdominal pain. EXAM: CT ABDOMEN AND PELVIS WITHOUT CONTRAST TECHNIQUE: Multidetector CT imaging of the abdomen and pelvis was performed following the standard protocol without IV contrast. COMPARISON:  None FINDINGS: Lower chest: Basilar atelectasis. No pleural effusion. Hepatobiliary: Severe hepatic steatosis. Area of more profound low-density just above the gallbladder fossa but showing more rounded morphology is of uncertain significance. Lobular hepatic contours. Sludge in the gallbladder. No pericholecystic stranding. No gross biliary ductal dilation. Pancreas: Pancreas is normal without focal lesion or ductal dilation. Spleen: Spleen is normal size, no focal lesion. Adrenals/Urinary Tract: Adrenal glands are normal. Renal vascular calcifications bilaterally. Nonobstructing 2 mm upper pole calculus on the LEFT. No ureteral calculi. No hydronephrosis. Urinary bladder under distended limiting assessment. Stomach/Bowel: No acute gastrointestinal process. Colonic diverticulosis. Appendix is normal. Vascular/Lymphatic: Calcified atheromatous plaque in the abdominal aorta. No aneurysmal dilation. No adenopathy in the retroperitoneum or upper abdomen. No pelvic lymphadenopathy. Reproductive: Calcifications in the prostate a nonspecific finding., Other: Moderate RIGHT and small LEFT fat containing inguinal hernias. Musculoskeletal: Spinal degenerative changes. No acute or destructive bone process. IMPRESSION: 1. Severe hepatic steatosis. Area of more profound low-density just above the gallbladder fossa but showing more rounded morphology is of uncertain significance. When the patient is clinically stable and able to follow directions and hold their breath (preferably as an outpatient) further  evaluation with dedicated abdominal MRI should be considered. This may represent an area of focal fat but lobulated hepatic contours and the severity of steatotic changes raise the question of developing liver disease and potential focal hepatic lesion. 2. Nonobstructing 2 mm LEFT upper pole calculus. 3. Colonic diverticulosis without diverticulitis. 4. Moderate RIGHT and small LEFT fat containing inguinal hernias. 5. Aortic atherosclerosis. Aortic Atherosclerosis (ICD10-I70.0). Electronically Signed   By: Zetta Bills M.D.   On: 05/08/2020 20:07   DG Chest 2 View  Result Date: 05/08/2020 CLINICAL DATA:  Chest pain, syncope. Additional history provided: Patient reports "passing out" , cramping in abdomen and legs, smoker. EXAM: CHEST - 2 VIEW COMPARISON:  Chest radiograph 01/17/2018 FINDINGS: Heart size within normal limits. There is no appreciable airspace consolidation. No evidence of pleural effusion or pneumothorax. No acute bony abnormality identified. Thoracic spondylosis with multilevel ventrolateral osteophytes. Thoracic dextrocurvature. IMPRESSION: No evidence of acute cardiopulmonary abnormality. Electronically Signed   By: Kellie Simmering DO   On: 05/08/2020 11:26   CT Head Wo Contrast  Result Date: 05/08/2020 CLINICAL DATA:  Posttraumatic headache after fall last night. EXAM: CT HEAD WITHOUT CONTRAST TECHNIQUE: Contiguous axial images were obtained from the base of the skull through the vertex without intravenous contrast. COMPARISON:  August 03, 2018. FINDINGS: Brain: No evidence of acute infarction, hemorrhage, hydrocephalus, extra-axial collection or mass lesion/mass effect. Vascular: No hyperdense vessel or unexpected calcification. Skull: Normal. Negative for fracture or focal lesion. Sinuses/Orbits: No acute finding. Other: Fluid is noted in left mastoid air cells. IMPRESSION: Left mastoid effusion. No acute intracranial abnormality seen. Electronically Signed   By: Marijo Conception M.D.   On:  05/08/2020  11:22   MR BRAIN WO CONTRAST  Result Date: 05/09/2020 CLINICAL DATA:  Seizure EXAM: MRI HEAD WITHOUT CONTRAST TECHNIQUE: Multiplanar, multiecho pulse sequences of the brain and surrounding structures were obtained without intravenous contrast. COMPARISON:  CT head 05/08/2020 FINDINGS: Brain: Moderate atrophy without hydrocephalus. Negative for acute infarct, hemorrhage, mass. Minimal changes in the white matter. Normal brainstem Vascular: Loss of flow void in the right internal carotid artery consistent with occlusion or slow flow. Normal flow voids in the left carotid and basilar artery. Skull and upper cervical spine: No focal skeletal abnormality Sinuses/Orbits: Mild mucosal edema paranasal sinuses. Left mastoid effusion. No orbital lesion Other: None IMPRESSION: Negative for acute infarct.  Generalized atrophy Loss of flow void right internal carotid artery compatible with occlusion or slow flow. Electronically Signed   By: Franchot Gallo M.D.   On: 05/09/2020 18:29   MR LIVER W WO CONTRAST  Result Date: 05/09/2020 CLINICAL DATA:  Diaphoresis and shaking with nausea and abdominal pain. Hepatic steatosis on ultrasound EXAM: MRI ABDOMEN WITHOUT AND WITH CONTRAST TECHNIQUE: Multiplanar multisequence MR imaging of the abdomen was performed both before and after the administration of intravenous contrast. CONTRAST:  81mL GADAVIST GADOBUTROL 1 MMOL/ML IV SOLN COMPARISON:  05/08/2020 CT scan FINDINGS: Despite efforts by the technologist and patient, severe motion artifact is present on today's exam and could not be eliminated. This reduces exam sensitivity and specificity. This is a common result when MRCP is attempted in the inpatient setting where patients are less likely to be able to breath hold and cooperate in controlling motion. Lower chest: Unremarkable Hepatobiliary: Diffuse low T1 and T2 signal in the liver with dropout of signal on out of phase images compared to in phase, but low signal  on inphase images as well. This likely represents combination of hepatic steatosis and hemochromatosis. There is some low signal in the spleen as well, which could indicate a component of hemosiderosis. In the region of the small area of concern along the gallbladder fossa seen on the CT scan, no abnormality is visualized, although the severity of motion artifact grossly lowers diagnostic sensitivity especially for such a small lesion. The gallbladder appears unremarkable. No biliary dilatation. Pancreas:  Unremarkable Spleen: Low T1 signal in the spleen, there may be a component of hemosiderosis. No splenomegaly or focal splenic lesion. Adrenals/Urinary Tract:  Unremarkable Stomach/Bowel: Unremarkable Vascular/Lymphatic: Aortoiliac atherosclerotic vascular disease. No pathologic adenopathy. Other:  No supplemental non-categorized findings. Musculoskeletal: Levoconvex lumbar scoliosis with rotary component. Lumbar spondylosis. IMPRESSION: 1. The small area of concern in the gallbladder fossa is not seen on today's exam, and may well have been incidental on prior CT. However, today's exam is severely adversely affected by motion artifact. 2. Diffuse hepatic steatosis and possibly superimposed hemochromatosis. 3. 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 4. Levoconvex lumbar scoliosis with rotary component. 5.  Aortic Atherosclerosis (ICD10-I70.0). Electronically Signed   By: Van Clines M.D.   On: 05/09/2020 18:48   US Carotid Bilateral  Result Date: 05/10/2020 CLINICAL DATA:  59 year old male with a history of carotid stenosis EXAM: BILATERAL CAROTID DUPLEX ULTRASOUND TECHNIQUE: Pearline Cables scale imaging, color Doppler and duplex ultrasound were performed of bilateral carotid and vertebral arteries in the neck. COMPARISON:  None. FINDINGS: Criteria: Quantification of carotid stenosis is based on velocity parameters that correlate the residual internal carotid diameter with  NASCET-based stenosis levels, using the diameter of the distal internal carotid lumen as the denominator for stenosis measurement. The following velocity  measurements were obtained: RIGHT ICA: Occlusion CCA:  64 cm/sec SYSTOLIC ICA/CCA RATIO:  Not applicable ECA:  96 cm/sec LEFT ICA:  Systolic 72 cm/sec, Diastolic 20 cm/sec CCA:  77 cm/sec SYSTOLIC ICA/CCA RATIO:  0.8 ECA:  108 cm/sec Right Brachial SBP: Not acquired Left Brachial SBP: Not acquired RIGHT CAROTID ARTERY: High resistance waveform of the common carotid artery. Occlusion of the ICA. ECA is patent. RIGHT VERTEBRAL ARTERY: Antegrade flow with low resistance waveform. LEFT CAROTID ARTERY: No significant calcifications of the left common carotid artery. Intermediate waveform maintained. Heterogeneous and partially calcified plaque at the left carotid bifurcation without significant lumen shadowing. Low resistance waveform of the left ICA. No significant tortuosity. LEFT VERTEBRAL ARTERY:  Antegrade flow with low resistance waveform. IMPRESSION: Right: Right ICA occlusion, uncertain chronicity. Left: Color duplex indicates minimal heterogeneous and calcified plaque of the left carotid bifurcation, with no hemodynamically significant stenosis by duplex criteria in the extracranial cerebrovascular circulation. Signed, Dulcy Fanny. Dellia Nims, RPVI [facility] Electronically Signed   By: Corrie Mckusick D.O.   On: 05/10/2020 11:42   EEG adult  Result Date: 05/09/2020 Phillips Odor, MD     05/09/2020  4:44 PM [place] A. Merlene Laughter, MD     [email or web address]       HISTORY: This is a 59 year old man who presents with recurrent seizures MEDICATIONS: Current Facility-Administered Medications:   0.9 % NaCl with KCl 20 mEq/ L  infusion, , Intravenous, Continuous, Yezenia Fredrick, MD, Last Rate: 100 mL/hr at 05/08/20 1728, New Bag at 05/08/20 1728   acetaminophen (TYLENOL) tablet 650 mg, 650 mg,  Oral, Q6H PRN **OR** acetaminophen (TYLENOL) suppository 650 mg, 650 mg, Rectal, Q6H PRN, Kemia Wendel, MD   amLODipine (NORVASC) tablet 10 mg, 10 mg, Oral, Daily, Catrell Morrone, MD, 10 mg at 05/09/20 1016   enoxaparin (LOVENOX) injection 40 mg, 40 mg, Subcutaneous, Q24H, Cherl Gorney, MD, 40 mg at [id number]   folic acid (FOLVITE) tablet 1 mg, 1 mg, Oral, Daily, Anslie Spadafora, MD, 1 mg at 05/09/20 1016   levETIRAcetam (KEPPRA) tablet 500 mg, 500 mg, Oral, BID, Merlene Laughter, Kofi, MD, 500 mg at 05/09/20 1016   LORazepam (ATIVAN) tablet 1-4 mg, 1-4 mg, Oral, Q1H PRN, 2 mg at 05/09/20 1016 **OR** LORazepam (ATIVAN) injection 1-4 mg, 1-4 mg, Intravenous, Q1H PRN, Kyna Blahnik, Shanon Brow, MD, 2 mg at 05/08/20 2224   multivitamin with minerals tablet 1 tablet, 1 tablet, Oral, Daily, Shaunda Tipping, MD, 1 tablet at 05/09/20 1016   ondansetron (ZOFRAN) tablet 4 mg, 4 mg, Oral, Q6H PRN **OR** ondansetron (ZOFRAN) injection 4 mg, 4 mg, Intravenous, Q6H PRN, Asahd Can, MD   pantoprazole (PROTONIX) EC tablet 40 mg, 40 mg, Oral, BID AC, Lucella Pommier, Shanon Brow, MD, 40 mg at 05/09/20 1016   thiamine tablet 100 mg, 100 mg, Oral, Daily **OR** thiamine (B-1) injection 100 mg, 100 mg, Intravenous, Daily, Aristeo Hankerson, MD   thiamine tablet 100 mg, 100 mg, Oral, Daily, Destani Wamser, MD, 100 mg at 05/09/20 1016 ANALYSIS: A 16 channel recording using standard 10 20 measurements is conducted for 25 minutes.  There is a well-formed posterior dominant rhythm of 12 Hz which attenuates with eye opening.  There is beta activity observed in frontal areas.  Awake and drowsy architecture are noted.  There is also occasional spindles and K complexes observed consistent with stage II non-REM sleep.  Photic stimulation and hyperventilation are not conducted.  There is no focal or lateralized slowing.  There is  no epileptiform activity observed.  There is occasional generalized rhythmic delta activity most noted in the frontocentral region. IMPRESSION: 1.  Generalized rhythmic delta  activity is noted which is stable seen in toxic metabolic processes.  No epileptiform activity is noted. Kofi A. Merlene Laughter, M.D. Diplomate, Tax adviser of Psychiatry and Neurology ( Neurology).   US Abdomen Limited RUQ  Result Date: 05/09/2020 CLINICAL DATA:  Elevated transaminase EXAM: ULTRASOUND ABDOMEN LIMITED RIGHT UPPER QUADRANT COMPARISON:  Abdominal CT from yesterday FINDINGS: Gallbladder: Layering sludge.  No stone, wall thickening or focal tenderness. Common bile duct: Diameter: 4 mm Liver: Echogenic liver with diminished acoustic penetration. No evidence of mass lesion. Portal vein is patent on color Doppler imaging with normal direction of blood flow towards the liver. IMPRESSION: 1. Hepatic steatosis. 2. Gallbladder sludge. Electronically Signed   By: Monte Fantasia M.D.   On: 05/09/2020 09:27     Microbiology: Recent Results (from the past 240 hour(s))  SARS Coronavirus 2 by RT PCR (hospital order, performed in [facility] hospital lab) Nasopharyngeal Nasopharyngeal Swab     Status: None   Collection Time: 05/08/20 12:20 PM   Specimen: Nasopharyngeal Swab  Result Value Ref Range Status   SARS Coronavirus 2 NEGATIVE NEGATIVE Final    Comment: (NOTE) SARS-CoV-2 target nucleic acids are NOT DETECTED. The SARS-CoV-2 RNA is generally detectable in upper and lower respiratory specimens during the acute phase of infection. The lowest concentration of SARS-CoV-2 viral copies this assay can detect is 250 copies / mL. A negative result does not preclude SARS-CoV-2 infection and should not be used as the sole basis for treatment or other patient management decisions.  A negative result may occur with improper specimen collection / handling, submission of specimen other than nasopharyngeal swab, presence of viral mutation(s) within the areas targeted by this assay, and inadequate number of viral copies (<250 copies / mL). A negative result must be combined with clinical observations,  patient history, and epidemiological information. Fact Sheet for Patients:   StrictlyIdeas.no Fact Sheet for Healthcare Providers: BankingDealers.co.za This test is not yet approved or cleared  by the [place] FDA and has been authorized for detection and/or diagnosis of SARS-CoV-2 by FDA under an Emergency Use Authorization (EUA).  This EUA will remain in effect (meaning this test can be used) for the duration of the COVID-19 declaration under Section 564(b)(1) of the Act, 21 U.S.C. section 360bbb-3(b)(1), unless the authorization is terminated or revoked sooner. Performed at [facility], [address], [place], [place] [address]      Labs: Basic Metabolic Panel: Recent Labs  Lab 05/08/20 1102 05/08/20 1102 05/08/20 1103 05/08/20 1659 05/08/20 1659 05/09/20 0514 05/10/20 0511  NA 129*  --   --  129*  --  131* 130*  K 3.8   < >  --  3.5   < > 3.6 3.5  CL 87*  --   --  85*  --  89* 93*  CO2 23  --   --  26  --  26 25  GLUCOSE 169*  --   --  131*  --  77 88  BUN 12  --   --  15  --  19 21*  CREATININE 1.16  --   --  1.29*  --  1.16 1.01  CALCIUM 9.5  --   --  9.7  --  9.4 9.0  MG  --   --  1.5* 2.0  --  2.0 1.8  PHOS  --   --   --  2.6  --   --   --    < > = values in this interval not displayed.   Liver Function Tests: Recent Labs  Lab 05/08/20 1102 05/08/20 1659 05/09/20 0514  AST 78* 81* 69*  ALT 39 40 30  ALKPHOS 87 88 68  BILITOT 2.1* 2.0* 1.8*  PROT 8.2* 8.5* 6.8  ALBUMIN 4.5 4.7 3.7   No results for input(s): LIPASE, AMYLASE in the last 168 hours. No results for input(s): AMMONIA in the last 168 hours. CBC: Recent Labs  Lab 05/08/20 1102 05/08/20 1659 05/09/20 0514 05/10/20 0511  WBC 6.7 7.3 7.7 7.2  NEUTROABS 5.1  --   --   --   HGB 16.4 16.7 14.1 13.6  HCT 46.2 47.7 40.6 39.9  MCV 105.0* 105.3* 106.0* 106.4*  PLT 134* 115* 100* 92*   Cardiac Enzymes: No results for input(s): CKTOTAL, CKMB,  CKMBINDEX, TROPONINI in the last 168 hours. BNP: Invalid input(s): POCBNP CBG: No results for input(s): GLUCAP in the last 168 hours.  Time coordinating discharge:  36 minutes  Signed:  Orson Eva, DO [facility] Pager: [phone] 05/10/2020, 3:45 PM

## 2020-05-09 NOTE — Procedures (Signed)
  [place] A. Merlene Laughter, MD     [email or web address]           HISTORY: This is a 59 year old man who presents with recurrent seizures  MEDICATIONS:  Current Facility-Administered Medications:  .  0.9 % NaCl with KCl 20 mEq/ L  infusion, , Intravenous, Continuous, Tat, David, MD, Last Rate: 100 mL/hr at 05/08/20 1728, New Bag at 05/08/20 1728 .  acetaminophen (TYLENOL) tablet 650 mg, 650 mg, Oral, Q6H PRN **OR** acetaminophen (TYLENOL) suppository 650 mg, 650 mg, Rectal, Q6H PRN, Tat, David, MD .  amLODipine (NORVASC) tablet 10 mg, 10 mg, Oral, Daily, Tat, David, MD, 10 mg at 05/09/20 1016 .  enoxaparin (LOVENOX) injection 40 mg, 40 mg, Subcutaneous, Q24H, Tat, David, MD, 40 mg at 05/08/20 2219 .  folic acid (FOLVITE) tablet 1 mg, 1 mg, Oral, Daily, Tat, David, MD, 1 mg at 05/09/20 1016 .  levETIRAcetam (KEPPRA) tablet 500 mg, 500 mg, Oral, BID, Chrles Selley, MD, 500 mg at 05/09/20 1016 .  LORazepam (ATIVAN) tablet 1-4 mg, 1-4 mg, Oral, Q1H PRN, 2 mg at 05/09/20 1016 **OR** LORazepam (ATIVAN) injection 1-4 mg, 1-4 mg, Intravenous, Q1H PRN, Tat, David, MD, 2 mg at 05/08/20 2224 .  multivitamin with minerals tablet 1 tablet, 1 tablet, Oral, Daily, Tat, David, MD, 1 tablet at 05/09/20 1016 .  ondansetron (ZOFRAN) tablet 4 mg, 4 mg, Oral, Q6H PRN **OR** ondansetron (ZOFRAN) injection 4 mg, 4 mg, Intravenous, Q6H PRN, Tat, David, MD .  pantoprazole (PROTONIX) EC tablet 40 mg, 40 mg, Oral, BID AC, Tat, Shanon Brow, MD, 40 mg at 05/09/20 1016 .  thiamine tablet 100 mg, 100 mg, Oral, Daily **OR** thiamine (B-1) injection 100 mg, 100 mg, Intravenous, Daily, Tat, David, MD .  thiamine tablet 100 mg, 100 mg, Oral, Daily, Tat, David, MD, 100 mg at 05/09/20 1016     ANALYSIS: A 16 channel recording using standard 10 20 measurements is conducted for 25 minutes.  There is a well-formed posterior dominant rhythm of 12 Hz which attenuates with eye opening.  There is beta activity observed  in frontal areas.  Awake and drowsy architecture are noted.  There is also occasional spindles and K complexes observed consistent with stage II non-REM sleep.  Photic stimulation and hyperventilation are not conducted.  There is no focal or lateralized slowing.  There is no epileptiform activity observed.  There is occasional generalized rhythmic delta activity most noted in the frontocentral region.   IMPRESSION: 1.  Generalized rhythmic delta activity is noted which is stable seen in toxic metabolic processes.  No epileptiform activity is noted.      Aristeo Hankerson A. Merlene Laughter, M.D.  Diplomate, Tax adviser of Psychiatry and Neurology ( Neurology).

## 2020-05-09 NOTE — Progress Notes (Signed)
PROGRESS NOTE  Andrew Knapp [id number] DOB: 1961/12/30 DOA: 05/08/2020 PCP: Asencion Noble, MD  Brief History:  58 y.o. male with medical history of alcohol withdrawal seizures, alcohol abuse, hypertension, hypothyroidism presenting with syncope.  The patient states that he has had syncopal episodes 2-3 times per week for the past 3 months.  He states that he has been falling out of bed from sleep at nighttime and waking up on the floor.  He states that when he initially falls out of bed he does not know he has fallen out of bed and essentially wakes up sometime in the morning.  His girlfriend with whom he lives sleeps in a different room so there is no witnessed activity.  In addition, he states that he has had episodes where he is watching television and "falls asleep" and subsequently wakes up on the floor.  Finally, his last set of episodes occurs usually after he is getting up from the commode in the bathroom.  He states that he had been his tongue in the past prior to his teeth being removed.  He is now edentulous.  He denies any bowel or bladder incontinence.  He states that he has hit his head and scalp on many occasions from the syncopal episodes.  He states that he has not drank any alcohol in 2 weeks.  Notably, the patient takes chronic alprazolam.  He states that he ran out of his approximately 2 days prior to this admission.  He denies any illegal drugs.  He denies any new prescription medications.  He continues to smoke 1/2 pack/day.  He denies otherwise chest pain, shortness breath, fevers, chills, visual disturbance, coughing, hemoptysis, diarrhea, hematochezia, melena.  The patient states that he has had nausea and vomiting on a daily basis for the past 2 weeks.  He denies eating any undercooked or unusual foods.  He has occasional crampy abdominal pain.  Notably, the patient was admitted from 08/03/2018 to 08/05/2018 form alcohol withdrawal seizures.  However, the patient states that he  had previously been on some seizure medicine which she took for a couple months, but he ran out and did not follow-up. In the emergency department, the patient was afebrile hemodynamically stable with oxygen saturation 100% room air.  Sodium 129 and serum creatinine 1.16.  AST 78, ALT 39, alkaline phosphatase 87, total bilirubin 2.1.  WBC 6.7, hemoglobin 16.4, platelets 134,000.  Assessment/Plan: Syncope -Due to seizure, possibly alprazolam withdrawal seizure -He has not drank any alcohol in 2 weeks -Alcohol level undetectable -Orthostatic vital signs -Remain on telemetry--personally reviewed--no concerning dysrhythmia -EEG -MRI brain -Neurology consult appreciated-->start Keppra -Urine drug screen positive for benzodiazepine -EKG shows sinus rhythm with nonspecific T wave change  Intractable nausea and vomiting/Abdominal pain -Due to alcoholic gastritis/esophagitis -continue IV fluids -still not tolerating diet -had episode emesis last night -PRN Zofran -Start PPI bid -Clear liquid diet -CT abd/pelvis--severe hepatic steatosis, low density area in hepatic fossa; moderate R and small L inguinal hernia; diverticulosis -MRI abd -UA--neg for pyuria  Essential hypertension -Continue amlodipine -Holding lisinopril  Hypothyroidism -pt states he is not taking synthroid anymore -TSH--4.320 -Free T4--0.87  Depression/anxiety -Continue Paxil  Transaminasemia -Right upper quadrant ultrasound--GB sludge, no GB thickening; no ductal dilatation; severe liver steatosis -hepatitis B surface antigen--neg -hepatitis C antibody--neg  Tobacco abuse -tobacco cessation discussed  Hyponatremia -Secondary to volume depletion from vomiting -continue IV fluids-->remains hyponatremic  Thrombocytopenia -chronic due to Etoh myelosuppression -B12--334 -folate--3.3  Disposition Plan: Patient From: Home D/C Place: Home - 1-2  Days Barriers: Not Clinically Stable--cannot  tolerate diet; on IVF  Family Communication:  No Family at bedside  Consultants:  neurology  Code Status:  FULL  DVT Prophylaxis:   [place] Lovenox   Procedures: As Listed in Progress Note Above  Antibiotics: None     Subjective: Patient denies fevers, chills, headache, chest pain, dyspnea, diarrhea, abdominal pain, dysuria, hematuria, hematochezia, and melena.  Still had some n/v last night   Objective: Vitals:   05/09/20 0218 05/09/20 0440 05/09/20 0804 05/09/20 1200  BP: (!) 172/74 (!) 113/92 134/82 131/90  Pulse: 72 69 63 74  Resp: 20 18 20 19   Temp: 98 F (36.7 C) 98.4 F (36.9 C) 98.5 F (36.9 C) 98.4 F (36.9 C)  TempSrc: Oral Oral Oral Oral  SpO2: 99% 99% (!) 89% 97%  Weight:      Height:        Intake/Output Summary (Last 24 hours) at 05/09/2020 1405 Last data filed at 05/09/2020 1242 Gross per 24 hour  Intake 770.51 ml  Output --  Net 770.51 ml   Weight change:  Exam:   General:  Pt is alert, follows commands appropriately, not in acute distress  HEENT: No icterus, No thrush, No neck mass, [place]/AT  Cardiovascular: RRR, S1/S2, no rubs, no gallops  Respiratory: bibasilar rales. No wheeze  Abdomen: Soft/+BS, non tender, non distended, no guarding  Extremities: No edema, No lymphangitis, No petechiae, No rashes, no synovitis   Data Reviewed: I have personally reviewed following labs and imaging studies Basic Metabolic Panel: Recent Labs  Lab 05/08/20 1102 05/08/20 1103 05/08/20 1659 05/09/20 0514  NA 129*  --  129* 131*  K 3.8  --  3.5 3.6  CL 87*  --  85* 89*  CO2 23  --  26 26  GLUCOSE 169*  --  131* 77  BUN 12  --  15 19  CREATININE 1.16  --  1.29* 1.16  CALCIUM 9.5  --  9.7 9.4  MG  --  1.5* 2.0 2.0  PHOS  --   --  2.6  --    Liver Function Tests: Recent Labs  Lab 05/08/20 1102 05/08/20 1659 05/09/20 0514  AST 78* 81* 69*  ALT 39 40 30  ALKPHOS 87 88 68  BILITOT 2.1* 2.0* 1.8*  PROT 8.2* 8.5* 6.8  ALBUMIN 4.5 4.7 3.7    No results for input(s): LIPASE, AMYLASE in the last 168 hours. No results for input(s): AMMONIA in the last 168 hours. Coagulation Profile: No results for input(s): INR, PROTIME in the last 168 hours. CBC: Recent Labs  Lab 05/08/20 1102 05/08/20 1659 05/09/20 0514  WBC 6.7 7.3 7.7  NEUTROABS 5.1  --   --   HGB 16.4 16.7 14.1  HCT 46.2 47.7 40.6  MCV 105.0* 105.3* 106.0*  PLT 134* 115* 100*   Cardiac Enzymes: No results for input(s): CKTOTAL, CKMB, CKMBINDEX, TROPONINI in the last 168 hours. BNP: Invalid input(s): POCBNP CBG: No results for input(s): GLUCAP in the last 168 hours. HbA1C: Recent Labs    05/08/20 1704  HGBA1C 5.4   Urine analysis:    Component Value Date/Time   COLORURINE AMBER (A) 05/08/2020 1126   APPEARANCEUR HAZY (A) 05/08/2020 1126   LABSPEC 1.017 05/08/2020 1126   PHURINE 7.0 05/08/2020 1126   GLUCOSEU 50 (A) 05/08/2020 1126   HGBUR SMALL (A) 05/08/2020 1126   BILIRUBINUR NEGATIVE 05/08/2020 1126   KETONESUR  20 (A) 05/08/2020 1126   PROTEINUR >=300 (A) 05/08/2020 1126   NITRITE NEGATIVE 05/08/2020 1126   LEUKOCYTESUR NEGATIVE 05/08/2020 1126   Sepsis Labs: @LABRCNTIP (procalcitonin:4,lacticidven:4) ) Recent Results (from the past 240 hour(s))  SARS Coronavirus 2 by RT PCR (hospital order, performed in [facility] hospital lab) Nasopharyngeal Nasopharyngeal Swab     Status: None   Collection Time: 05/08/20 12:20 PM   Specimen: Nasopharyngeal Swab  Result Value Ref Range Status   SARS Coronavirus 2 NEGATIVE NEGATIVE Final    Comment: (NOTE) SARS-CoV-2 target nucleic acids are NOT DETECTED. The SARS-CoV-2 RNA is generally detectable in upper and lower respiratory specimens during the acute phase of infection. The lowest concentration of SARS-CoV-2 viral copies this assay can detect is 250 copies / mL. A negative result does not preclude SARS-CoV-2 infection and should not be used as the sole basis for treatment or other patient  management decisions.  A negative result may occur with improper specimen collection / handling, submission of specimen other than nasopharyngeal swab, presence of viral mutation(s) within the areas targeted by this assay, and inadequate number of viral copies (<250 copies / mL). A negative result must be combined with clinical observations, patient history, and epidemiological information. Fact Sheet for Patients:   StrictlyIdeas.no Fact Sheet for Healthcare Providers: BankingDealers.co.za This test is not yet approved or cleared  by the [place] FDA and has been authorized for detection and/or diagnosis of SARS-CoV-2 by FDA under an Emergency Use Authorization (EUA).  This EUA will remain in effect (meaning this test can be used) for the duration of the COVID-19 declaration under Section 564(b)(1) of the Act, 21 U.S.C. section 360bbb-3(b)(1), unless the authorization is terminated or revoked sooner. Performed at [facility], [address], [place], [place] [address]      Scheduled Meds: . amLODipine  10 mg Oral Daily  . enoxaparin (LOVENOX) injection  40 mg Subcutaneous Q24H  . folic acid  1 mg Oral Daily  . levETIRAcetam  500 mg Oral BID  . multivitamin with minerals  1 tablet Oral Daily  . pantoprazole  40 mg Oral BID AC  . thiamine  100 mg Oral Daily   Or  . thiamine  100 mg Intravenous Daily  . thiamine  100 mg Oral Daily   Continuous Infusions: . 0.9 % NaCl with KCl 20 mEq / L 100 mL/hr at 05/08/20 1728    Procedures/Studies: CT ABDOMEN PELVIS WO CONTRAST  Result Date: 05/08/2020 CLINICAL DATA:  Abdominal distension with nausea vomiting and abdominal pain. EXAM: CT ABDOMEN AND PELVIS WITHOUT CONTRAST TECHNIQUE: Multidetector CT imaging of the abdomen and pelvis was performed following the standard protocol without IV contrast. COMPARISON:  None FINDINGS: Lower chest: Basilar atelectasis. No pleural effusion.  Hepatobiliary: Severe hepatic steatosis. Area of more profound low-density just above the gallbladder fossa but showing more rounded morphology is of uncertain significance. Lobular hepatic contours. Sludge in the gallbladder. No pericholecystic stranding. No gross biliary ductal dilation. Pancreas: Pancreas is normal without focal lesion or ductal dilation. Spleen: Spleen is normal size, no focal lesion. Adrenals/Urinary Tract: Adrenal glands are normal. Renal vascular calcifications bilaterally. Nonobstructing 2 mm upper pole calculus on the LEFT. No ureteral calculi. No hydronephrosis. Urinary bladder under distended limiting assessment. Stomach/Bowel: No acute gastrointestinal process. Colonic diverticulosis. Appendix is normal. Vascular/Lymphatic: Calcified atheromatous plaque in the abdominal aorta. No aneurysmal dilation. No adenopathy in the retroperitoneum or upper abdomen. No pelvic lymphadenopathy. Reproductive: Calcifications in the prostate a nonspecific finding., Other: Moderate  RIGHT and small LEFT fat containing inguinal hernias. Musculoskeletal: Spinal degenerative changes. No acute or destructive bone process. IMPRESSION: 1. Severe hepatic steatosis. Area of more profound low-density just above the gallbladder fossa but showing more rounded morphology is of uncertain significance. When the patient is clinically stable and able to follow directions and hold their breath (preferably as an outpatient) further evaluation with dedicated abdominal MRI should be considered. This may represent an area of focal fat but lobulated hepatic contours and the severity of steatotic changes raise the question of developing liver disease and potential focal hepatic lesion. 2. Nonobstructing 2 mm LEFT upper pole calculus. 3. Colonic diverticulosis without diverticulitis. 4. Moderate RIGHT and small LEFT fat containing inguinal hernias. 5. Aortic atherosclerosis. Aortic Atherosclerosis (ICD10-I70.0). Electronically  Signed   By: Zetta Bills M.D.   On: 05/08/2020 20:07   DG Chest 2 View  Result Date: 05/08/2020 CLINICAL DATA:  Chest pain, syncope. Additional history provided: Patient reports "passing out" , cramping in abdomen and legs, smoker. EXAM: CHEST - 2 VIEW COMPARISON:  Chest radiograph 01/17/2018 FINDINGS: Heart size within normal limits. There is no appreciable airspace consolidation. No evidence of pleural effusion or pneumothorax. No acute bony abnormality identified. Thoracic spondylosis with multilevel ventrolateral osteophytes. Thoracic dextrocurvature. IMPRESSION: No evidence of acute cardiopulmonary abnormality. Electronically Signed   By: Kellie Simmering DO   On: 05/08/2020 11:26   CT Head Wo Contrast  Result Date: 05/08/2020 CLINICAL DATA:  Posttraumatic headache after fall last night. EXAM: CT HEAD WITHOUT CONTRAST TECHNIQUE: Contiguous axial images were obtained from the base of the skull through the vertex without intravenous contrast. COMPARISON:  August 03, 2018. FINDINGS: Brain: No evidence of acute infarction, hemorrhage, hydrocephalus, extra-axial collection or mass lesion/mass effect. Vascular: No hyperdense vessel or unexpected calcification. Skull: Normal. Negative for fracture or focal lesion. Sinuses/Orbits: No acute finding. Other: Fluid is noted in left mastoid air cells. IMPRESSION: Left mastoid effusion. No acute intracranial abnormality seen. Electronically Signed   By: Marijo Conception M.D.   On: 05/08/2020 11:22   US Abdomen Limited RUQ  Result Date: 05/09/2020 CLINICAL DATA:  Elevated transaminase EXAM: ULTRASOUND ABDOMEN LIMITED RIGHT UPPER QUADRANT COMPARISON:  Abdominal CT from yesterday FINDINGS: Gallbladder: Layering sludge.  No stone, wall thickening or focal tenderness. Common bile duct: Diameter: 4 mm Liver: Echogenic liver with diminished acoustic penetration. No evidence of mass lesion. Portal vein is patent on color Doppler imaging with normal direction of blood flow  towards the liver. IMPRESSION: 1. Hepatic steatosis. 2. Gallbladder sludge. Electronically Signed   By: Monte Fantasia M.D.   On: 05/09/2020 09:27    Orson Eva, DO  [facility]  If 7PM-7AM, please contact night-coverage [email or web address] Password [email or web address] 05/09/2020, 2:05 PM   LOS: 0 days

## 2020-05-10 ENCOUNTER — Inpatient Hospital Stay (HOSPITAL_COMMUNITY): Payer: Medicaid Other

## 2020-05-10 DIAGNOSIS — I6529 Occlusion and stenosis of unspecified carotid artery: Secondary | ICD-10-CM

## 2020-05-10 DIAGNOSIS — R112 Nausea with vomiting, unspecified: Secondary | ICD-10-CM

## 2020-05-10 DIAGNOSIS — I6521 Occlusion and stenosis of right carotid artery: Secondary | ICD-10-CM

## 2020-05-10 LAB — BASIC METABOLIC PANEL
Anion gap: 12 (ref 5–15)
BUN: 21 mg/dL — ABNORMAL HIGH (ref 6–20)
CO2: 25 mmol/L (ref 22–32)
Calcium: 9 mg/dL (ref 8.9–10.3)
Chloride: 93 mmol/L — ABNORMAL LOW (ref 98–111)
Creatinine, Ser: 1.01 mg/dL (ref 0.61–1.24)
GFR calc Af Amer: >60 mL/min (ref >60)
GFR calc non Af Amer: >60 mL/min (ref >60)
Glucose, Bld: 88 mg/dL (ref 70–99)
Potassium: 3.5 mmol/L (ref 3.5–5.1)
Sodium: 130 mmol/L — ABNORMAL LOW (ref 135–145)

## 2020-05-10 LAB — CBC
HCT: 39.9 % (ref 39.0–52.0)
Hemoglobin: 13.6 g/dL (ref 13.0–17.0)
MCH: 36.3 pg — ABNORMAL HIGH (ref 26.0–34.0)
MCHC: 34.1 g/dL (ref 30.0–36.0)
MCV: 106.4 fL — ABNORMAL HIGH (ref 80.0–100.0)
Platelets: 92 10*3/uL — ABNORMAL LOW (ref 150–400)
RBC: 3.75 MIL/uL — ABNORMAL LOW (ref 4.22–5.81)
RDW: 13.6 % (ref 11.5–15.5)
WBC: 7.2 10*3/uL (ref 4.0–10.5)
nRBC: 0 % (ref 0.0–0.2)

## 2020-05-10 LAB — MAGNESIUM: Magnesium: 1.8 mg/dL (ref 1.7–2.4)

## 2020-05-10 MED ORDER — ASPIRIN 81 MG PO CHEW: 81.0000 mg | CHEWABLE_TABLET | Freq: Every day | ORAL | Status: DC

## 2020-05-10 MED ORDER — LEVETIRACETAM 500 MG PO TABS: 500.0000 mg | ORAL_TABLET | Freq: Two times a day (BID) | ORAL | 1 refills | Status: AC

## 2020-05-10 MED ORDER — ASPIRIN 81 MG PO CHEW
81.0000 mg | CHEWABLE_TABLET | Freq: Every day | ORAL | Status: DC
  Administered 2020-05-10: 81 mg via ORAL
  Filled 2020-05-10: qty 1

## 2020-05-10 NOTE — Evaluation (Signed)
Physical Therapy Evaluation Patient Details Name: Andrew Knapp MRN: [id number] DOB: [birth year] Today's Date: 05/10/2020   History of Present Illness  Andrew Knapp is a 59 y.o. male with medical history of alcohol withdrawal seizures, alcohol abuse, hypertension, hypothyroidism presenting with syncope.  The patient states that he has had syncopal episodes 2-3 times per week for the past 3 months.  He states that he has been falling out of bed from sleep at nighttime and waking up on the floor.  He states that when he initially falls out of bed he does not know he has fallen out of bed and essentially wakes up sometime in the morning.  His girlfriend with whom he lives sleeps in a different room so there is no witnessed activity.  In addition, he states that he has had episodes where he is watching television and "falls asleep" and subsequently wakes up on the floor.  Finally, his last set of episodes occurs usually after he is getting up from the commode in the bathroom.  He states that he had been his tongue in the past prior to his teeth being removed.  He is now edentulous.  He denies any bowel or bladder incontinence.  He states that he has hit his head and scalp on many occasions from the syncopal episodes.  He states that he has not drank any alcohol in 2 weeks.  Notably, the patient takes chronic alprazolam.  He states that he ran out of his approximately 2 days prior to this admission.  He denies any illegal drugs.  He denies any new prescription medications.  He continues to smoke 1/2 pack/day.  He denies otherwise chest pain, shortness breath, fevers, chills, visual disturbance, coughing, hemoptysis, diarrhea, hematochezia, melena.  The patient states that he has had nausea and vomiting on a daily basis for the past 2 weeks.  He denies eating any undercooked or unusual foods.  He has occasional crampy abdominal pain.  Notably, the patient was admitted from 08/03/2018 to 08/05/2018 form alcohol withdrawal  seizures.  However, the patient states that he had previously been on some seizure medicine which she took for a couple months, but he ran out and did not follow-up.In the emergency department, the patient was afebrile hemodynamically stable with oxygen saturation 100% room air.  Sodium 129 and serum creatinine 1.16.  AST 78, ALT 39, alkaline phosphatase 87, total bilirubin 2.1.  WBC 6.7, hemoglobin 16.4, platelets 134,000.    Clinical Impression  Patient functioning at baseline for functional mobility and gait.  Patient demonstrates good return for ambulation in room/hallways and transferring to commode in bathroom without AD and no loss of balance.  Plan:  Patient discharged from physical therapy to care of nursing for ambulation daily as tolerated for length of stay.     Follow Up Recommendations No PT follow up;Supervision - Intermittent    Equipment Recommendations  None recommended by PT    Recommendations for Other Services       Precautions / Restrictions Precautions Precautions: None Restrictions Weight Bearing Restrictions: No      Mobility  Bed Mobility Overal bed mobility: Modified Independent                Transfers Overall transfer level: Modified independent Equipment used: None                Ambulation/Gait Ambulation/Gait assistance: Modified independent (Device/Increase time) Gait Distance (Feet): 100 Feet Assistive device: None Gait Pattern/deviations: WFL(Within Functional Limits) Gait velocity: slightly  decreased   General Gait Details: slightly labored cadence without loss of balance  Stairs            Wheelchair Mobility    Modified Rankin (Stroke Patients Only)       Balance Overall balance assessment: No apparent balance deficits (not formally assessed)                                           Pertinent Vitals/Pain Pain Assessment: No/denies pain    Home Living Family/patient expects to be  discharged to:: Private residence Living Arrangements: Non-relatives/Friends(girl friend) Available Help at Discharge: Friend(s);Available 24 hours/day Type of Home: House Home Access: Stairs to enter Entrance Stairs-Rails: Left;Right;Can reach both Entrance Stairs-Number of Steps: 4 Home Layout: One level Home Equipment: Walker - 2 wheels;Cane - single point;Shower seat;Crutches Additional Comments: Axillary crutches    Prior Function Level of Independence: Independent         Comments: Household ambulator without AD     Hand Dominance        Extremity/Trunk Assessment   Upper Extremity Assessment Upper Extremity Assessment: Overall WFL for tasks assessed    Lower Extremity Assessment Lower Extremity Assessment: Overall WFL for tasks assessed    Cervical / Trunk Assessment Cervical / Trunk Assessment: Normal  Communication   Communication: No difficulties  Cognition Arousal/Alertness: Awake/alert Behavior During Therapy: WFL for tasks assessed/performed Overall Cognitive Status: Within Functional Limits for tasks assessed                                        General Comments      Exercises     Assessment/Plan    PT Assessment Patent does not need any further PT services  PT Problem List         PT Treatment Interventions      PT Goals (Current goals can be found in the Care Plan section)  Acute Rehab PT Goals Patient Stated Goal: return home with girl friend to assist PT Goal Formulation: With patient Time For Goal Achievement: 05/10/20 Potential to Achieve Goals: Good    Frequency     Barriers to discharge        Co-evaluation               AM-PAC PT "6 Clicks" Mobility  Outcome Measure Help needed turning from your back to your side while in a flat bed without using bedrails?: None Help needed moving from lying on your back to sitting on the side of a flat bed without using bedrails?: None Help needed moving to  and from a bed to a chair (including a wheelchair)?: None Help needed standing up from a chair using your arms (e.g., wheelchair or bedside chair)?: None Help needed to walk in hospital room?: None Help needed climbing 3-5 steps with a railing? : None 6 Click Score: 24    End of Session   Activity Tolerance: Patient tolerated treatment well Patient left: in chair;with call bell/phone within reach Nurse Communication: Mobility status PT Visit Diagnosis: Unsteadiness on feet (R26.81);Other abnormalities of gait and mobility (R26.89);Muscle weakness (generalized) (M62.81)    Time: [id number] PT Time Calculation (min) (ACUTE ONLY): 31 min   Charges:   PT Evaluation $PT Eval Moderate Complexity: 1 Mod PT Treatments $Therapeutic Activity: 23-37  mins        11:42 AM, 05/10/20 Lonell Grandchild, MPT Physical Therapist with [facility] 336 [phone] office [phone] mobile phone

## 2020-06-22 ENCOUNTER — Other Ambulatory Visit: Payer: Self-pay

## 2020-06-22 ENCOUNTER — Ambulatory Visit (INDEPENDENT_AMBULATORY_CARE_PROVIDER_SITE_OTHER): Payer: Medicaid Other | Admitting: Vascular Surgery

## 2020-06-22 ENCOUNTER — Encounter: Payer: Self-pay | Admitting: Vascular Surgery

## 2020-06-22 VITALS — BP 128/72 | HR 67 | Temp 97.9°F | Resp 20 | Ht 74.0 in | Wt 202.0 lb

## 2020-06-22 DIAGNOSIS — I6521 Occlusion and stenosis of right carotid artery: Secondary | ICD-10-CM | POA: Diagnosis not present

## 2020-06-22 NOTE — Progress Notes (Signed)
Patient name: Andrew Knapp MRN: [id number] DOB: February 22, 1961 Sex: male  REASON FOR CONSULT: Right internal carotid artery occlusion  HPI: Andrew Knapp is a 59 y.o. male, who was noted to have occlusion of his right internal carotid artery on recent work-up for syncope.  Patient was recently admitted to [facility] and had an extensive work-up which included a carotid duplex scan.  This showed no significant stenosis on the left side but a chronic right internal carotid artery occlusion.  Patient also had an MRI of the brain which showed no evidence of stroke.  He has chronic seizures but states these are better controlled currently.  He has had no symptoms of TIA amaurosis or stroke.  He is on daily aspirin.  He currently smokes about 2 packs a day.  He is not really interested in quitting.    Past Medical History:  Diagnosis Date  . Alcoholism ([place])   . Carotid artery occlusion   . Hypertension   . Seizure disorder [facility])    Past Surgical History:  Procedure Laterality Date  . FOOT FRACTURE SURGERY    . HAND SURGERY      History reviewed. No pertinent family history.  SOCIAL HISTORY: Social History   Socioeconomic History  . Marital status: Legally Separated    Spouse name: Not on file  . Number of children: Not on file  . Years of education: Not on file  . Highest education level: Not on file  Occupational History  . Not on file  Tobacco Use  . Smoking status: Current Every Day Smoker    Packs/day: 0.50    Types: Cigarettes  . Smokeless tobacco: Never Used  Vaping Use  . Vaping Use: Never used  Substance and Sexual Activity  . Alcohol use: Yes    Alcohol/week: 2.0 standard drinks    Types: 2 Shots of liquor per week    Comment: reports normal couple shots per day  . Drug use: Not Currently  . Sexual activity: Yes  Other Topics Concern  . Not on file  Social History Narrative  . Not on file   Social Determinants of Health   Financial Resource Strain:     . Difficulty of Paying Living Expenses:   Food Insecurity:   . Worried About Charity fundraiser in the Last Year:   . Arboriculturist in the Last Year:   Transportation Needs:   . Film/video editor (Medical):   [name] Lack of Transportation (Non-Medical):   Physical Activity:   . Days of Exercise per Week:   . Minutes of Exercise per Session:   Stress:   . Feeling of Stress :   Social Connections:   . Frequency of Communication with Friends and Family:   . Frequency of Social Gatherings with Friends and Family:   . Attends Religious Services:   . Active Member of Clubs or Organizations:   . Attends Archivist Meetings:   [name] Marital Status:   Intimate Partner Violence:   . Fear of Current or Ex-Partner:   . Emotionally Abused:   [name] Physically Abused:   . Sexually Abused:     No Known Allergies  Current Outpatient Medications  Medication Sig Dispense Refill  . ALPRAZolam (XANAX) 1 MG tablet Take 1 mg by mouth 3 (three) times daily as needed for sleep.     [name] amLODipine (NORVASC) 10 MG tablet Take 1 tablet (10 mg total) by mouth daily. [place]  tablet 0  . aspirin 81 MG chewable tablet Chew 1 tablet (81 mg total) by mouth daily.    [name] levETIRAcetam (KEPPRA) 500 MG tablet Take 1 tablet (500 mg total) by mouth 2 (two) times daily. 60 tablet 1  . lisinopril (PRINIVIL,ZESTRIL) 20 MG tablet Take 1 tablet (20 mg total) by mouth daily. 30 tablet 0  . PARoxetine (PAXIL) 30 MG tablet Take 1 tablet (30 mg total) by mouth daily. 30 tablet 0  . folic acid (FOLVITE) 1 MG tablet Take 1 tablet (1 mg total) by mouth daily. (Patient not taking: Reported on 05/08/2020)    . Multiple Vitamin (MULTIVITAMIN WITH MINERALS) TABS tablet Take 1 tablet by mouth daily. (Patient not taking: Reported on 05/08/2020)    . thiamine 100 MG tablet Take 1 tablet (100 mg total) by mouth daily. (Patient not taking: Reported on 05/08/2020)     No current facility-administered medications for this visit.    ROS:    General:  No weight loss, Fever, chills  HEENT: No recent headaches, no nasal bleeding, no visual changes, no sore throat  Neurologic: No dizziness, +blackouts, +seizures. No recent symptoms of stroke or mini- stroke. No recent episodes of slurred speech, or temporary blindness.  Cardiac: No recent episodes of chest pain/pressure, no shortness of breath at rest.  No shortness of breath with exertion.  Denies history of atrial fibrillation or irregular heartbeat  Vascular: No history of rest pain in feet.  No history of claudication.  No history of non-healing ulcer, No history of DVT   Pulmonary: No home oxygen, no productive cough, no hemoptysis,  No asthma or wheezing  Musculoskeletal:  [ ]  Arthritis, [ ]  Low back pain,  [ ]  Joint pain  Hematologic:No history of hypercoagulable state.  No history of easy bleeding.  No history of anemia  Gastrointestinal: No hematochezia or melena,  No gastroesophageal reflux, no trouble swallowing  Urinary: [ ]  chronic Kidney disease, [ ]  on HD - [ ]  MWF or [ ]  TTHS, [ ]  Burning with urination, [ ]  Frequent urination, [ ]  Difficulty urinating;   Skin: No rashes  Psychological: No history of anxiety,  No history of depression   Physical Examination  Vitals:   06/22/20 1029 06/22/20 1031  BP: 132/73 128/72  Pulse: 67   Resp: 20   Temp: 97.9 F (36.6 C)   SpO2: 94%   Weight: 202 lb (91.6 kg)   Height: 6\' 2"  (1.88 m)     Body mass index is 25.94 kg/m.  General:  Alert and oriented, no acute distress HEENT: Normal Neck: No bruit or JVD Pulmonary: Clear to auscultation bilaterally Cardiac: Regular Rate and Rhythm  Skin: No rash Extremity Pulses:  2+ radial, brachial, femoral, 2+ right absent left dorsalis pedis, absent right 2+ left posterior tibial pulses bilaterally Musculoskeletal: No deformity or edema  Neurologic: Upper and lower extremity motor 5/5 and symmetric  DATA:  Patient had a carotid duplex scan performed by  radiology May 10, 2020 which showed no significant left carotid stenosis with some calcification.  Right internal carotid artery was occluded.  ASSESSMENT: Chronic occlusion right internal carotid artery no significant left-sided stenosis   PLAN: Patient will follow up in 1 year with repeat carotid duplex exam.  If this is unchanged at his 1 year visit probably does not need further follow-up.  Discussed with patient today no intervention necessary for carotid occlusion.  I did discuss with him smoking cessation.  He will be seen in  our APP clinic when he returns.   Ruta Hinds, MD [facility] Office: [phone]

## 2020-07-21 NOTE — H&P (Signed)
Surgical History & Physical  Patient Name: Andrew Knapp, Andrew Knapp DOB: [birth year]  Surgery: Cataract extraction with intraocular lens implant phacoemulsification; Right Eye  Surgeon: Baruch Goldmann MD Surgery Date:  07/28/2020 Pre-Op Date:  07/20/2020  HPI: A 9 Yr. old male patient is referred by Dr Jorja Loa for cataract eval 1. 1. The patient complains of difficulty when driving, which began 6 months ago. Both eyes are affected. The episode is gradual. The condition's severity increased since last visit. Symptoms occur when the patient is driving, inside and outside. The complaint is associated with glare. Pt states "street lights are like christmas lights" This is negatively affecting the patient's quality of life. HPI was performed by Baruch Goldmann .  Medical History: Cataracts High Blood Pressure Seizures  Review of Systems Negative Allergic/Immunologic Negative Cardiovascular Negative Constitutional Negative Ear, Nose, Mouth & Throat Negative Endocrine Negative Eyes Negative Gastrointestinal Negative Genitourinary Negative Hemotologic/Lymphatic Negative Integumentary Negative Musculoskeletal Negative Neurological Negative Psychiatry Negative Respiratory  Social   Current every day smoker of Cigarettes   Medication Paxil, Seizure med, Aspirin, Xanax, BP med,   Sx/Procedures Corneal FB extraction,  Hand Surgery,   Drug Allergies   NKDA  History & Physical: Heent:  Cataract, Right eye NECK: supple without bruits LUNGS: lungs clear to auscultation CV: regular rate and rhythm Abdomen: soft and non-tender  Impression & Plan: Assessment: 1.  COMBINED FORMS AGE RELATED CATARACT; Both Eyes (H25.813) 2.  BLEPHARITIS; Right Upper Lid, Right Lower Lid, Left Upper Lid, Left Lower Lid (H01.001, H01.002,H01.004,H01.005) 3.  DERMATOCHALASIS, no surgery (H02.831, H02.83-,H02.834) 4.  Pinguecula; Both Eyes (H11.153)  Plan: 1.  Cataract accounts for the patient's decreased  vision. This visual impairment is not correctable with a tolerable change in glasses or contact lenses. Cataract surgery with an implantation of a new lens should significantly improve the visual and functional status of the patient. Discussed all risks, benefits, alternatives, and potential complications. Discussed the procedures and recovery. Patient desires to have surgery. A-scan ordered and performed today for intra-ocular lens calculations. The surgery will be performed in order to improve vision for driving, reading, and for eye examinations. Recommend phacoemulsification with intra-ocular lens. Right Eye worse - first. Dilates poorly - shugacaine by protocol. Omidira. 2.  Begin/continue lid scrubs. 3.  Asymptomatic, recommend observation for now. Findings, prognosis and treatment options reviewed. 4.  Observe; Artificial tears as needed for irritation.

## 2020-07-26 ENCOUNTER — Other Ambulatory Visit (HOSPITAL_COMMUNITY): Admission: RE | Admit: 2020-07-26 | Payer: Medicaid Other | Source: Ambulatory Visit

## 2020-07-26 ENCOUNTER — Encounter (HOSPITAL_COMMUNITY): Admission: RE | Admit: 2020-07-26 | Payer: Medicaid Other | Source: Ambulatory Visit

## 2020-07-27 ENCOUNTER — Encounter (HOSPITAL_COMMUNITY): Payer: Self-pay

## 2020-07-27 ENCOUNTER — Encounter (HOSPITAL_COMMUNITY)
Admission: RE | Admit: 2020-07-27 | Discharge: 2020-07-27 | Disposition: A | Discharge status: Home / Self Care | Payer: Medicaid Other | Source: Ambulatory Visit | Attending: Ophthalmology | Admitting: Ophthalmology

## 2020-07-27 ENCOUNTER — Other Ambulatory Visit (HOSPITAL_COMMUNITY)
Admission: RE | Admit: 2020-07-27 | Discharge: 2020-07-27 | Disposition: A | Discharge status: Home / Self Care | Payer: Medicaid Other | Source: Ambulatory Visit | Attending: Ophthalmology | Admitting: Ophthalmology

## 2020-07-27 ENCOUNTER — Other Ambulatory Visit: Payer: Self-pay

## 2020-07-27 DIAGNOSIS — Z01812 Encounter for preprocedural laboratory examination: Secondary | ICD-10-CM | POA: Diagnosis present

## 2020-07-27 DIAGNOSIS — Z20822 Contact with and (suspected) exposure to covid-19: Secondary | ICD-10-CM | POA: Insufficient documentation

## 2020-07-27 LAB — SARS CORONAVIRUS 2 (TAT 6-24 HRS): SARS Coronavirus 2: NEGATIVE

## 2020-07-27 NOTE — OR Nursing (Signed)
Patient called informed him to take these meds in the morning with a sip of water,  Xanax, amlodipine, Keppra & paxil

## 2020-07-28 ENCOUNTER — Ambulatory Visit (HOSPITAL_COMMUNITY): Payer: Medicaid Other | Admitting: Anesthesiology

## 2020-07-28 ENCOUNTER — Ambulatory Visit (HOSPITAL_COMMUNITY)
Admission: RE | Admit: 2020-07-28 | Discharge: 2020-07-28 | Disposition: A | Discharge status: Home / Self Care | Payer: Medicaid Other | Attending: Ophthalmology | Admitting: Ophthalmology

## 2020-07-28 ENCOUNTER — Encounter (HOSPITAL_COMMUNITY)
Admission: RE | Disposition: A | Discharge status: Home / Self Care | Payer: Self-pay | Source: Home / Self Care | Attending: Ophthalmology

## 2020-07-28 DIAGNOSIS — Z7982 Long term (current) use of aspirin: Secondary | ICD-10-CM | POA: Insufficient documentation

## 2020-07-28 DIAGNOSIS — I10 Essential (primary) hypertension: Secondary | ICD-10-CM | POA: Diagnosis not present

## 2020-07-28 DIAGNOSIS — F17210 Nicotine dependence, cigarettes, uncomplicated: Secondary | ICD-10-CM | POA: Diagnosis not present

## 2020-07-28 DIAGNOSIS — H0100A Unspecified blepharitis right eye, upper and lower eyelids: Secondary | ICD-10-CM | POA: Insufficient documentation

## 2020-07-28 DIAGNOSIS — R569 Unspecified convulsions: Secondary | ICD-10-CM | POA: Insufficient documentation

## 2020-07-28 DIAGNOSIS — H0100B Unspecified blepharitis left eye, upper and lower eyelids: Secondary | ICD-10-CM | POA: Insufficient documentation

## 2020-07-28 DIAGNOSIS — H25811 Combined forms of age-related cataract, right eye: Secondary | ICD-10-CM | POA: Insufficient documentation

## 2020-07-28 DIAGNOSIS — I739 Peripheral vascular disease, unspecified: Secondary | ICD-10-CM | POA: Diagnosis not present

## 2020-07-28 DIAGNOSIS — Z79899 Other long term (current) drug therapy: Secondary | ICD-10-CM | POA: Insufficient documentation

## 2020-07-28 HISTORY — PX: CATARACT EXTRACTION W/PHACO: SHX586

## 2020-07-28 SURGERY — PHACOEMULSIFICATION, CATARACT, WITH IOL INSERTION
Anesthesia: Monitor Anesthesia Care | Site: Eye | Laterality: Right

## 2020-07-28 MED ORDER — LIDOCAINE HCL 3.5 % OP GEL
1.0000 "application " | Freq: Once | OPHTHALMIC | Status: AC
  Administered 2020-07-28: 1 via OPHTHALMIC

## 2020-07-28 MED ORDER — BSS IO SOLN
INTRAOCULAR | Status: DC | PRN
  Administered 2020-07-28: 15 mL via INTRAOCULAR

## 2020-07-28 MED ORDER — POVIDONE-IODINE 5 % OP SOLN
OPHTHALMIC | Status: DC | PRN
  Administered 2020-07-28: 1 via OPHTHALMIC

## 2020-07-28 MED ORDER — MIDAZOLAM HCL 5 MG/5ML IJ SOLN
INTRAMUSCULAR | Status: DC | PRN
  Administered 2020-07-28: 2 mg via INTRAVENOUS

## 2020-07-28 MED ORDER — PROVISC 10 MG/ML IO SOLN
INTRAOCULAR | Status: DC | PRN
  Administered 2020-07-28: 0.85 mL via INTRAOCULAR

## 2020-07-28 MED ORDER — LIDOCAINE HCL (PF) 1 % IJ SOLN
INTRAOCULAR | Status: DC | PRN
  Administered 2020-07-28: 1 mL via OPHTHALMIC

## 2020-07-28 MED ORDER — TETRACAINE HCL 0.5 % OP SOLN
1.0000 [drp] | OPHTHALMIC | Status: AC | PRN
  Administered 2020-07-28 (×3): 1 [drp] via OPHTHALMIC

## 2020-07-28 MED ORDER — NEOMYCIN-POLYMYXIN-DEXAMETH 3.5-10000-0.1 OP SUSP
OPHTHALMIC | Status: DC | PRN
  Administered 2020-07-28: 1 [drp] via OPHTHALMIC

## 2020-07-28 MED ORDER — CYCLOPENTOLATE-PHENYLEPHRINE 0.2-1 % OP SOLN
1.0000 [drp] | OPHTHALMIC | Status: AC | PRN
  Administered 2020-07-28 (×3): 1 [drp] via OPHTHALMIC

## 2020-07-28 MED ORDER — PHENYLEPHRINE HCL 2.5 % OP SOLN
1.0000 [drp] | OPHTHALMIC | Status: AC | PRN
  Administered 2020-07-28 (×3): 1 [drp] via OPHTHALMIC

## 2020-07-28 MED ORDER — PHENYLEPHRINE-KETOROLAC 1-0.3 % IO SOLN
INTRAOCULAR | Status: DC | PRN
  Administered 2020-07-28: 500 mL via OPHTHALMIC

## 2020-07-28 MED ORDER — MIDAZOLAM HCL 2 MG/2ML IJ SOLN
INTRAMUSCULAR | Status: AC
  Filled 2020-07-28: qty 2

## 2020-07-28 MED ORDER — SODIUM HYALURONATE 23 MG/ML IO SOLN
INTRAOCULAR | Status: DC | PRN
  Administered 2020-07-28: 0.6 mL via INTRAOCULAR

## 2020-07-28 MED ORDER — PHENYLEPHRINE-KETOROLAC 1-0.3 % IO SOLN
INTRAOCULAR | Status: AC
  Filled 2020-07-28: qty 4

## 2020-07-28 SURGICAL SUPPLY — 13 items
CLOTH BEACON ORANGE TIMEOUT ST (SAFETY) ×2 IMPLANT
EYE SHIELD UNIVERSAL CLEAR (GAUZE/BANDAGES/DRESSINGS) ×2 IMPLANT
GLOVE BIOGEL PI IND STRL 7.0 (GLOVE) IMPLANT
GLOVE BIOGEL PI INDICATOR 7.0 (GLOVE) ×4
LENS ALC ACRYL/TECN (Ophthalmic Related) ×2 IMPLANT
NDL HYPO 18GX1.5 BLUNT FILL (NEEDLE) IMPLANT
NEEDLE HYPO 18GX1.5 BLUNT FILL (NEEDLE) ×3 IMPLANT
PAD ARMBOARD 7.5X6 YLW CONV (MISCELLANEOUS) ×2 IMPLANT
SYR TB 1ML LL NO SAFETY (SYRINGE) ×2 IMPLANT
TAPE SURG TRANSPORE 1 IN (GAUZE/BANDAGES/DRESSINGS) IMPLANT
TAPE SURGICAL TRANSPORE 1 IN (GAUZE/BANDAGES/DRESSINGS) ×3
VISCOELASTIC ADDITIONAL (OPHTHALMIC RELATED) ×2 IMPLANT
WATER STERILE IRR 250ML POUR (IV SOLUTION) ×2 IMPLANT

## 2020-07-28 NOTE — Progress Notes (Signed)
Abrasion to left elbow. No drainage. Pt states he fell the "other" day. Ecchymosis to left forearm. Denies pain.

## 2020-07-28 NOTE — Discharge Instructions (Signed)
Please discharge patient when stable, will follow up today with Dr. Mutasim Tuckey at the [facility] office immediately following discharge.  Leave shield in place until visit.  All paperwork with discharge instructions will be given at the office.  [facility] Address:  [address]  [place], [place] [address]  

## 2020-07-28 NOTE — Op Note (Signed)
Date of procedure: 07/28/20  Pre-operative diagnosis:  Visually significant combined form age-related cataract, Right Eye (H25.811)  Post-operative diagnosis:  Visually significant combined form age-related cataract, Right Eye (H25.811)  Procedure: Removal of cataract via phacoemulsification and insertion of intra-ocular lens [employer] [id number]  +21.5D into the capsular bag of the Right Eye  Attending surgeon: Pleasant A. Bassel Gaskill, MD, MA  Anesthesia: MAC, Topical [place]  Complications: None  Estimated Blood Loss: <1mL (minimal)  Specimens: None  Implants: As above  Indications:  Visually significant age-related cataract, Right Eye  Procedure:  The patient was seen and identified in the pre-operative area. The operative eye was identified and dilated.  The operative eye was marked.  Topical anesthesia was administered to the operative eye.     The patient was then to the operative suite and placed in the supine position.  A timeout was performed confirming the patient, procedure to be performed, and all other relevant information.   The patient's face was prepped and draped in the usual fashion for intra-ocular surgery.  A lid speculum was placed into the operative eye and the surgical microscope moved into place and focused.  A superotemporal paracentesis was created using a 20 gauge paracentesis blade.  Shugarcaine was injected into the anterior chamber.  Viscoelastic was injected into the anterior chamber.  A temporal clear-corneal main wound incision was created using a 2.4mm microkeratome.  A continuous curvilinear capsulorrhexis was initiated using an irrigating cystitome and completed using capsulorrhexis forceps.  Hydrodissection and hydrodeliniation were performed.  Viscoelastic was injected into the anterior chamber.  A phacoemulsification handpiece and a chopper as a second instrument were used to remove the nucleus and epinucleus. The irrigation/aspiration handpiece was  used to remove any remaining cortical material.   The capsular bag was reinflated with viscoelastic, checked, and found to be intact.  The intraocular lens was inserted into the capsular bag.  The irrigation/aspiration handpiece was used to remove any remaining viscoelastic.  The clear corneal wound and paracentesis wounds were then hydrated and checked with Weck-Cels to be watertight.  The lid-speculum was removed.  The drape was removed.  The patient's face was cleaned with a wet and dry 4x4.   Maxitrol was instilled in the eye. A clear shield was taped over the eye. The patient was taken to the post-operative care unit in good condition, having tolerated the procedure well.  Post-Op Instructions: The patient will follow up at [facility] for a same day post-operative evaluation and will receive all other orders and instructions.  

## 2020-07-28 NOTE — Anesthesia Postprocedure Evaluation (Signed)
Anesthesia Post Note  Patient: Andrew Knapp  Procedure(s) Performed: CATARACT EXTRACTION PHACO AND INTRAOCULAR LENS PLACEMENT RIGHT EYE (Right Eye)  Patient location during evaluation: Short Stay Anesthesia Type: MAC Level of consciousness: awake, oriented, awake and alert and patient cooperative Pain management: pain level controlled Vital Signs Assessment: post-procedure vital signs reviewed and stable Respiratory status: spontaneous breathing, respiratory function stable and nonlabored ventilation Cardiovascular status: blood pressure returned to baseline and stable Postop Assessment: no headache and no backache Anesthetic complications: no   No complications documented.   Last Vitals:  Vitals:   07/28/20 0945 07/28/20 1000  BP: 118/67   Pulse: 53 61  Resp: 14 16  Temp:    SpO2: 97% 97%    Last Pain:  Vitals:   07/28/20 0934  PainSc: 0-No pain                 Tacy Learn

## 2020-07-28 NOTE — Interval H&P Note (Signed)
History and Physical Interval Note:  07/28/2020 9:56 AM  Andrew Knapp  has presented today for surgery, with the diagnosis of nuclear cataract right eye.  The various methods of treatment have been discussed with the patient and family. After consideration of risks, benefits and other options for treatment, the patient has consented to  Procedure(s) with comments: CATARACT EXTRACTION PHACO AND INTRAOCULAR LENS PLACEMENT (IOC) (Right) - right as a surgical intervention.  The patient's history has been reviewed, patient examined, no change in status, stable for surgery.  I have reviewed the patient's chart and labs.  Questions were answered to the patient's satisfaction.     Baruch Goldmann

## 2020-07-28 NOTE — Transfer of Care (Signed)
Immediate Anesthesia Transfer of Care Note  Patient: Andrew Knapp  Procedure(s) Performed: CATARACT EXTRACTION PHACO AND INTRAOCULAR LENS PLACEMENT RIGHT EYE (Right Eye)  Patient Location: PACU and Short Stay  Anesthesia Type:MAC  Level of Consciousness: awake, alert , oriented and patient cooperative  Airway & Oxygen Therapy: Patient Spontanous Breathing  Post-op Assessment: Report given to RN, Post -op Vital signs reviewed and stable and Patient moving all extremities  Post vital signs: Reviewed and stable  Last Vitals:  Vitals Value Taken Time  BP    Temp    Pulse    Resp    SpO2      Last Pain:  Vitals:   07/28/20 0934  PainSc: 0-No pain         Complications: No complications documented.

## 2020-07-28 NOTE — Anesthesia Preprocedure Evaluation (Addendum)
Anesthesia Evaluation  Patient identified by MRN, date of birth, ID band Patient awake    Reviewed: Allergy & Precautions, NPO status , Patient's Chart, lab work & pertinent test results  History of Anesthesia Complications Negative for: history of anesthetic complications  Airway Mallampati: III  TM Distance: >3 FB Neck ROM: Full    Dental  (+) Edentulous Upper, Edentulous Lower   Pulmonary Current SmokerPatient did not abstain from smoking.,  Room air saturation- 92%  Room air saturation 92% Pulmonary exam normal breath sounds clear to auscultation       Cardiovascular Exercise Tolerance: Good hypertension, Pt. 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)  Normal cardiovascular exam Rhythm:Regular Rate:Normal - Systolic murmurs, - Diastolic murmurs, - Friction Rub, - Carotid Bruit, - Peripheral Edema and - Systolic Click    Neuro/Psych Seizures - (last seizure - 04/2020, took keppra this morning), Well Controlled,  PSYCHIATRIC DISORDERS    GI/Hepatic negative GI ROS, (+)     substance abuse (alcohol abuse - 4 to 6 drinks/day)  alcohol use,   Endo/Other  Hypothyroidism   Renal/GU negative Renal ROS  negative genitourinary   Musculoskeletal negative musculoskeletal ROS (+)   Abdominal   Peds negative pediatric ROS (+)  Hematology negative hematology ROS (+)   Anesthesia Other Findings   Reproductive/Obstetrics negative OB ROS                           Anesthesia Physical Anesthesia Plan  ASA: III  Anesthesia Plan: MAC   Post-op Pain Management:    Induction:   PONV Risk Score and Plan:   Airway Management Planned: Nasal Cannula and Natural Airway  Additional Equipment:   Intra-op Plan:   Post-operative Plan:   Informed Consent: I have reviewed the patients History and Physical, chart, labs and discussed the procedure  including the risks, benefits and alternatives for the proposed anesthesia with the patient or authorized representative who has indicated his/her understanding and acceptance.     Dental advisory given  Plan Discussed with: CRNA and Surgeon  Anesthesia Plan Comments:         Anesthesia Quick Evaluation

## 2020-07-31 ENCOUNTER — Encounter (HOSPITAL_COMMUNITY): Payer: Self-pay | Admitting: Ophthalmology

## 2020-08-14 NOTE — H&P (Signed)
Surgical History & Physical  Patient Name: Andrew Knapp DOB: [birth year]  Surgery: Cataract extraction with intraocular lens implant phacoemulsification; Left Eye  Surgeon: Baruch Goldmann MD Surgery Date:  08/21/2020 Pre-Op Date:  08/14/2020  HPI: A 34 Yr. old male patient The patient is returning after cataract surgery. The right eye is affected. Status post cornea surgery, which began 1 weeks ago: Since the last visit, the affected area is doing well. The patient's vision is improved. The complaint is associated with burning. Patient is following medication instructions TID. The patient experiences no flashes, floater, shadow, curtain or veil. The patient complains of difficulty when driving, which began 6 months ago. Left eyes are affected. The episode is gradual. The condition's severity increased since last visit. Symptoms occur when the patient is driving, inside and outside. The complaint is associated with glare. Pt states "street lights are like Christmas lights" This is negatively affecting the patient's quality of life. HPI was performed by Baruch Goldmann .  Medical History: Cataracts High Blood Pressure Seizures  Social   Current every day smoker of Cigarettes   Medication Prednisolone acetate 1%, Vigamox, Ilevro,  Paxil, Seizure med, Aspirin, Xanax, BP med,   Sx/Procedures Corneal FB extraction, Phaco c IOL OD,  Hand Surgery,   Review of Systems Negative Allergic/Immunologic Negative Cardiovascular Negative Constitutional Negative Ear, Nose, Mouth & Throat Negative Endocrine Negative Eyes Negative Gastrointestinal Negative Genitourinary Negative Hemotologic/Lymphatic Negative Integumentary Negative Musculoskeletal Negative Neurological Negative Psychiatry Negative Respiratory  Drug Allergies   NKDA  History & Physical: Heent:  Cataract, Left eye NECK: supple without bruits LUNGS: lungs clear to auscultation CV: regular rate and rhythm Abdomen: soft and  non-tender  Impression & Plan: Assessment: 1.  CATARACT EXTRACTION STATUS; Right Eye (Z98.41) 2.  INTRAOCULAR LENS IOL (Z96.1) 3.  COMBINED FORMS AGE RELATED CATARACT; Left Eye (H25.812)  Plan: 1.  1 week after cataract surgery. Doing well with improved vision and normal eye pressure. Call with any problems or concerns. Stop Vigamox. Continue Ilevro 1 drop 1x/day for 3 more weeks. Continue Pred Acetate 1 drop 2x/day for 3 more weeks. 2.  Stable. Doing well since surgery 3.  Dilates poorly - shugacaine by protocol. Omidira. Cataract accounts for the patient's decreased vision. This visual impairment is not correctable with a tolerable change in glasses or contact lenses. Cataract surgery with an implantation of a new lens should significantly improve the visual and functional status of the patient. Discussed all risks, benefits, alternatives, and potential complications. Discussed the procedures and recovery. Patient desires to have surgery. A-scan ordered and performed today for intra-ocular lens calculations. The surgery will be performed in order to improve vision for driving, reading, and for eye examinations. Recommend phacoemulsification with intra-ocular lens. Recommend Dextenza for post-operative pain and inflammation. Left Eye. Surgery required to correct imbalance of vision.

## 2020-08-17 ENCOUNTER — Other Ambulatory Visit: Payer: Self-pay

## 2020-08-17 ENCOUNTER — Encounter (HOSPITAL_COMMUNITY)
Admission: RE | Admit: 2020-08-17 | Discharge: 2020-08-17 | Disposition: A | Discharge status: Home / Self Care | Payer: Medicaid Other | Source: Ambulatory Visit | Attending: Ophthalmology | Admitting: Ophthalmology

## 2020-08-17 NOTE — Patient Instructions (Signed)
Your procedure is scheduled on:  08/21/2020               Report to [facility] at  11:00   AM.                Call this number if you have problems the morning of surgery: [phone]   Do not eat or drink :After Midnight.    Take these medicines the morning of surgery with A SIP OF WATER:     Amlodipine, paxil and keppra       Do not wear jewelry, make-up or nail polish.  Do not wear lotions, powders, or perfumes. You may wear deodorant.  Do not bring valuables to the hospital.  Contacts, dentures or bridgework may not be worn into surgery.  Patients discharged the day of surgery will not be allowed to drive home.  Name and phone number of your driver.                                                                                                                                       Cataract Surgery  A cataract is a clouding of the lens of the eye. When a lens becomes cloudy, vision is reduced based on the degree and nature of the clouding. Surgery may be needed to improve vision. Surgery removes the cloudy lens and usually replaces it with a substitute lens (intraocular lens, IOL). LET YOUR EYE DOCTOR KNOW ABOUT:  Allergies to food or medicine.   Medicines taken including herbs, eyedrops, over-the-counter medicines, and creams.   Use of steroids (by mouth or creams).   Previous problems with anesthetics or numbing medicine.   History of bleeding problems or blood clots.   Previous surgery.   Other health problems, including diabetes and kidney problems.   Possibility of pregnancy, if this applies.  RISKS AND COMPLICATIONS  Infection.   Inflammation of the eyeball (endophthalmitis) that can spread to both eyes (sympathetic ophthalmia).   Poor wound healing.   If an IOL is inserted, it can later fall out of proper position. This is very uncommon.   Clouding of the part of your eye that holds an IOL in place. This is called an "after-cataract." These are uncommon, but  easily treated.  BEFORE THE PROCEDURE  Do not eat or drink anything except small amounts of water for 8 to 12 before your surgery, or as directed by your caregiver.    Unless you are told otherwise, continue any eyedrops you have been prescribed.   Talk to your primary caregiver about all other medicines that you take (both prescription and non-prescription). In some cases, you may need to stop or change medicines near the time of your surgery. This is most important if you are taking blood-thinning medicine. Do not stop medicines unless you are told to do so.   Arrange for someone to drive you to and from  the procedure.   Do not put contact lenses in either eye on the day of your surgery.  PROCEDURE There is more than one method for safely removing a cataract. Your doctor can explain the differences and help determine which is best for you. Phacoemulsification surgery is the most common form of cataract surgery.  An injection is given behind the eye or eyedrops are given to make this a painless procedure.   A small cut (incision) is made on the edge of the clear, dome-shaped surface that covers the front of the eye (cornea).   A tiny probe is painlessly inserted into the eye. This device gives off ultrasound waves that soften and break up the cloudy center of the lens. This makes it easier for the cloudy lens to be removed by suction.   An IOL may be implanted.   The normal lens of the eye is covered by a clear capsule. Part of that capsule is intentionally left in the eye to support the IOL.   Your surgeon may or may not use stitches to close the incision.  There are other forms of cataract surgery that require a larger incision and stiches to close the eye. This approach is taken in cases where the doctor feels that the cataract cannot be easily removed using phacoemulsification. AFTER THE PROCEDURE  When an IOL is implanted, it does not need care. It becomes a permanent part of your  eye and cannot be seen or felt.   Your doctor will schedule follow-up exams to check on your progress.   Review your other medicines with your doctor to see which can be resumed after surgery.   Use eyedrops or take medicine as prescribed by your doctor.  Document Released: 12/05/2011 Document Reviewed: 12/02/2011 [facility] Patient Information 2012 [place].  .Cataract Surgery Care After Refer to this sheet in the next few weeks. These instructions provide you with information on caring for yourself after your procedure. Your caregiver may also give you more specific instructions. Your treatment has been planned according to current medical practices, but problems sometimes occur. Call your caregiver if you have any problems or questions after your procedure.  HOME CARE INSTRUCTIONS   Avoid strenuous activities as directed by your caregiver.   Ask your caregiver when you can resume driving.   Use eyedrops or other medicines to help healing and control pressure inside your eye as directed by your caregiver.   Only take over-the-counter or prescription medicines for pain, discomfort, or fever as directed by your caregiver.   Do not to touch or rub your eyes.   You may be instructed to use a protective shield during the first few days and nights after surgery. If not, wear sunglasses to protect your eyes. This is to protect the eye from pressure or from being accidentally bumped.   Keep the area around your eye clean and dry. Avoid swimming or allowing water to hit you directly in the face while showering. Keep soap and shampoo out of your eyes.   Do not bend or lift heavy objects. Bending increases pressure in the eye. You can walk, climb stairs, and do light household chores.   Do not put a contact lens into the eye that had surgery until your caregiver says it is okay to do so.   Ask your doctor when you can return to work. This will depend on the kind of work that you do. If  you work in a dusty environment, you may  be advised to wear protective eyewear for a period of time.   Ask your caregiver when it will be safe to engage in sexual activity.   Continue with your regular eye exams as directed by your caregiver.  What to expect:  It is normal to feel itching and mild discomfort for a few days after cataract surgery. Some fluid discharge is also common, and your eye may be sensitive to light and touch.   After 1 to 2 days, even moderate discomfort should disappear. In most cases, healing will take about 6 weeks.   If you received an intraocular lens (IOL), you may notice that colors are very bright or have a blue tinge. Also, if you have been in bright sunlight, everything may appear reddish for a few hours. If you see these color tinges, it is because your lens is clear and no longer cloudy. Within a few months after receiving an IOL, these extra colors should go away. When you have healed, you will probably need new glasses.  SEEK MEDICAL CARE IF:   You have increased bruising around your eye.   You have discomfort not helped by medicine.  SEEK IMMEDIATE MEDICAL CARE IF:   You have a  fever.   You have a worsening or sudden vision loss.   You have redness, swelling, or increasing pain in the eye.   You have a thick discharge from the eye that had surgery.  MAKE SURE YOU:  Understand these instructions.   Will watch your condition.   Will get help right away if you are not doing well or get worse.  Document Released: 07/05/2005 Document Revised: 12/05/2011 Document Reviewed: 08/09/2011 [facility] Patient Information 2012 [place].    Monitored Anesthesia Care  Monitored anesthesia care is an anesthesia service for a medical procedure. Anesthesia is the loss of the ability to feel pain. It is produced by medications called anesthetics. It may affect a small area of your body (local anesthesia), a large area of your body (regional anesthesia),  or your entire body (general anesthesia). The need for monitored anesthesia care depends your procedure, your condition, and the potential need for regional or general anesthesia. It is often provided during procedures where:   General anesthesia may be needed if there are complications. This is because you need special care when you are under general anesthesia.    You will be under local or regional anesthesia. This is so that you are able to have higher levels of anesthesia if needed.    You will receive calming medications (sedatives). This is especially the case if sedatives are given to put you in a semi-conscious state of relaxation (deep sedation). This is because the amount of sedative needed to produce this state can be hard to predict. Too much of a sedative can produce general anesthesia. Monitored anesthesia care is performed by one or more caregivers who have special training in all types of anesthesia. You will need to meet with these caregivers before your procedure. During this meeting, they will ask you about your medical history. They will also give you instructions to follow. (For example, you will need to stop eating and drinking before your procedure. You may also need to stop or change medications you are taking.) During your procedure, your caregivers will stay with you. They will:   Watch your condition. This includes watching you blood pressure, breathing, and level of pain.    Diagnose and treat problems that occur.    Give  medications if they are needed. These may include calming medications (sedatives) and anesthetics.    Make sure you are comfortable.   Having monitored anesthesia care does not necessarily mean that you will be under anesthesia. It does mean that your caregivers will be able to manage anesthesia if you need it or if it occurs. It also means that you will be able to have a different type of anesthesia than you are having if you need it. When your  procedure is complete, your caregivers will continue to watch your condition. They will make sure any medications wear off before you are allowed to go home.  Document Released: 09/11/2005 Document Revised: 04/12/2013 Document Reviewed: 01/27/2013 [facility] Patient Information 2014 [place], [place].

## 2020-08-18 ENCOUNTER — Other Ambulatory Visit (HOSPITAL_COMMUNITY)
Admission: RE | Admit: 2020-08-18 | Discharge: 2020-08-18 | Disposition: A | Discharge status: Home / Self Care | Payer: Medicaid Other | Source: Ambulatory Visit | Attending: Ophthalmology | Admitting: Ophthalmology

## 2020-08-18 ENCOUNTER — Other Ambulatory Visit: Payer: Self-pay

## 2020-08-18 DIAGNOSIS — Z01812 Encounter for preprocedural laboratory examination: Secondary | ICD-10-CM | POA: Diagnosis present

## 2020-08-18 DIAGNOSIS — Z20822 Contact with and (suspected) exposure to covid-19: Secondary | ICD-10-CM | POA: Insufficient documentation

## 2020-08-18 LAB — SARS CORONAVIRUS 2 (TAT 6-24 HRS): SARS Coronavirus 2: NEGATIVE

## 2020-08-21 ENCOUNTER — Ambulatory Visit (HOSPITAL_COMMUNITY): Payer: Medicaid Other | Admitting: Anesthesiology

## 2020-08-21 ENCOUNTER — Encounter (HOSPITAL_COMMUNITY)
Admission: RE | Disposition: A | Discharge status: Home / Self Care | Payer: Self-pay | Source: Home / Self Care | Attending: Ophthalmology

## 2020-08-21 ENCOUNTER — Other Ambulatory Visit: Payer: Self-pay

## 2020-08-21 ENCOUNTER — Ambulatory Visit (HOSPITAL_COMMUNITY)
Admission: RE | Admit: 2020-08-21 | Discharge: 2020-08-21 | Disposition: A | Discharge status: Home / Self Care | Payer: Medicaid Other | Attending: Ophthalmology | Admitting: Ophthalmology

## 2020-08-21 ENCOUNTER — Encounter (HOSPITAL_COMMUNITY): Payer: Self-pay | Admitting: Ophthalmology

## 2020-08-21 DIAGNOSIS — H25812 Combined forms of age-related cataract, left eye: Secondary | ICD-10-CM | POA: Insufficient documentation

## 2020-08-21 DIAGNOSIS — Z79899 Other long term (current) drug therapy: Secondary | ICD-10-CM | POA: Diagnosis not present

## 2020-08-21 DIAGNOSIS — F17210 Nicotine dependence, cigarettes, uncomplicated: Secondary | ICD-10-CM | POA: Insufficient documentation

## 2020-08-21 DIAGNOSIS — R569 Unspecified convulsions: Secondary | ICD-10-CM | POA: Diagnosis not present

## 2020-08-21 DIAGNOSIS — R0989 Other specified symptoms and signs involving the circulatory and respiratory systems: Secondary | ICD-10-CM | POA: Insufficient documentation

## 2020-08-21 DIAGNOSIS — Z7982 Long term (current) use of aspirin: Secondary | ICD-10-CM | POA: Insufficient documentation

## 2020-08-21 DIAGNOSIS — I6523 Occlusion and stenosis of bilateral carotid arteries: Secondary | ICD-10-CM | POA: Insufficient documentation

## 2020-08-21 DIAGNOSIS — Z961 Presence of intraocular lens: Secondary | ICD-10-CM | POA: Insufficient documentation

## 2020-08-21 DIAGNOSIS — Z9841 Cataract extraction status, right eye: Secondary | ICD-10-CM | POA: Insufficient documentation

## 2020-08-21 DIAGNOSIS — I739 Peripheral vascular disease, unspecified: Secondary | ICD-10-CM | POA: Insufficient documentation

## 2020-08-21 DIAGNOSIS — E039 Hypothyroidism, unspecified: Secondary | ICD-10-CM | POA: Insufficient documentation

## 2020-08-21 DIAGNOSIS — I10 Essential (primary) hypertension: Secondary | ICD-10-CM | POA: Diagnosis not present

## 2020-08-21 HISTORY — PX: CATARACT EXTRACTION W/PHACO: SHX586

## 2020-08-21 SURGERY — PHACOEMULSIFICATION, CATARACT, WITH IOL INSERTION
Anesthesia: Monitor Anesthesia Care | Site: Eye | Laterality: Left

## 2020-08-21 MED ORDER — MIDAZOLAM HCL 2 MG/2ML IJ SOLN
INTRAMUSCULAR | Status: DC | PRN
  Administered 2020-08-21 (×2): 1 mg via INTRAVENOUS

## 2020-08-21 MED ORDER — PHENYLEPHRINE HCL 2.5 % OP SOLN
1.0000 [drp] | OPHTHALMIC | Status: AC | PRN
  Administered 2020-08-21 (×3): 1 [drp] via OPHTHALMIC

## 2020-08-21 MED ORDER — LACTATED RINGERS IV SOLN: INTRAVENOUS | Status: DC

## 2020-08-21 MED ORDER — TETRACAINE HCL 0.5 % OP SOLN
1.0000 [drp] | OPHTHALMIC | Status: AC | PRN
  Administered 2020-08-21 (×3): 1 [drp] via OPHTHALMIC

## 2020-08-21 MED ORDER — LIDOCAINE HCL 3.5 % OP GEL
1.0000 "application " | Freq: Once | OPHTHALMIC | Status: AC
  Administered 2020-08-21: 1 via OPHTHALMIC

## 2020-08-21 MED ORDER — LIDOCAINE HCL 3.5 % OP GEL
OPHTHALMIC | Status: AC
  Filled 2020-08-21: qty 1

## 2020-08-21 MED ORDER — PROVISC 10 MG/ML IO SOLN
INTRAOCULAR | Status: DC | PRN
  Administered 2020-08-21: 0.85 mL via INTRAOCULAR

## 2020-08-21 MED ORDER — PHENYLEPHRINE-KETOROLAC 1-0.3 % IO SOLN
INTRAOCULAR | Status: DC | PRN
  Administered 2020-08-21: 500 mL via OPHTHALMIC

## 2020-08-21 MED ORDER — POVIDONE-IODINE 5 % OP SOLN
OPHTHALMIC | Status: DC | PRN
  Administered 2020-08-21: 1 via OPHTHALMIC

## 2020-08-21 MED ORDER — CYCLOPENTOLATE-PHENYLEPHRINE 0.2-1 % OP SOLN
1.0000 [drp] | OPHTHALMIC | Status: AC | PRN
  Administered 2020-08-21 (×3): 1 [drp] via OPHTHALMIC

## 2020-08-21 MED ORDER — NEOMYCIN-POLYMYXIN-DEXAMETH 3.5-10000-0.1 OP SUSP
OPHTHALMIC | Status: DC | PRN
  Administered 2020-08-21: 1 [drp] via OPHTHALMIC

## 2020-08-21 MED ORDER — BSS IO SOLN
INTRAOCULAR | Status: DC | PRN
  Administered 2020-08-21: 15 mL via INTRAOCULAR

## 2020-08-21 MED ORDER — LIDOCAINE HCL (PF) 1 % IJ SOLN
INTRAOCULAR | Status: DC | PRN
  Administered 2020-08-21: 1 mL via OPHTHALMIC

## 2020-08-21 MED ORDER — MIDAZOLAM HCL 2 MG/2ML IJ SOLN
INTRAMUSCULAR | Status: AC
  Filled 2020-08-21: qty 2

## 2020-08-21 MED ORDER — SODIUM HYALURONATE 23 MG/ML IO SOLN
INTRAOCULAR | Status: DC | PRN
  Administered 2020-08-21: 0.6 mL via INTRAOCULAR

## 2020-08-21 SURGICAL SUPPLY — 13 items
CLOTH BEACON ORANGE TIMEOUT ST (SAFETY) ×2 IMPLANT
EYE SHIELD UNIVERSAL CLEAR (GAUZE/BANDAGES/DRESSINGS) ×2 IMPLANT
GLOVE BIOGEL PI IND STRL 7.0 (GLOVE) IMPLANT
GLOVE BIOGEL PI INDICATOR 7.0 (GLOVE) ×4
LENS ALC ACRYL/TECN (Ophthalmic Related) ×2 IMPLANT
NDL HYPO 18GX1.5 BLUNT FILL (NEEDLE) IMPLANT
NEEDLE HYPO 18GX1.5 BLUNT FILL (NEEDLE) ×3 IMPLANT
PAD ARMBOARD 7.5X6 YLW CONV (MISCELLANEOUS) ×2 IMPLANT
SYR TB 1ML LL NO SAFETY (SYRINGE) ×2 IMPLANT
TAPE SURG TRANSPORE 1 IN (GAUZE/BANDAGES/DRESSINGS) IMPLANT
TAPE SURGICAL TRANSPORE 1 IN (GAUZE/BANDAGES/DRESSINGS) ×3
VISCOELASTIC ADDITIONAL (OPHTHALMIC RELATED) ×2 IMPLANT
WATER STERILE IRR 250ML POUR (IV SOLUTION) ×2 IMPLANT

## 2020-08-21 NOTE — Discharge Instructions (Signed)
Please discharge patient when stable, will follow up today with Dr. Catarina Huntley at the [facility] [place] office immediately following discharge.  Leave shield in place until visit.  All paperwork with discharge instructions will be given at the office.  [facility] [place] Address:  [address]  [place], [place] [address]  

## 2020-08-21 NOTE — Transfer of Care (Signed)
Immediate Anesthesia Transfer of Care Note  Patient: Andrew Knapp  Procedure(s) Performed: CATARACT EXTRACTION PHACO AND INTRAOCULAR LENS PLACEMENT (IOC) (Left Eye)  Patient Location: PACU  Anesthesia Type:MAC  Level of Consciousness: awake, alert  and oriented  Airway & Oxygen Therapy: Patient Spontanous Breathing  Post-op Assessment: Report given to RN, Post -op Vital signs reviewed and stable and Patient moving all extremities X 4  Post vital signs: Reviewed and stable  Last Vitals:  Vitals Value Taken Time  BP    Temp    Pulse    Resp    SpO2      Last Pain:  Vitals:   08/21/20 1127  TempSrc: Oral  PainSc: 8       Patients Stated Pain Goal: 8 (02/77/41 2878)  Complications: No complications documented.

## 2020-08-21 NOTE — Interval H&P Note (Signed)
History and Physical Interval Note:  08/21/2020 11:30 AM  Andrew Knapp  has presented today for surgery, with the diagnosis of Nuclear sclerotic cataract - Left eye.  The various methods of treatment have been discussed with the patient and family. After consideration of risks, benefits and other options for treatment, the patient has consented to  Procedure(s) with comments: CATARACT EXTRACTION PHACO AND INTRAOCULAR LENS PLACEMENT ([place]) (Left) - left as a surgical intervention.  The patient's history has been reviewed, patient examined, no change in status, stable for surgery.  I have reviewed the patient's chart and labs.  Questions were answered to the patient's satisfaction.     Baruch Goldmann

## 2020-08-21 NOTE — Op Note (Signed)
Date of procedure: 08/21/20  Pre-operative diagnosis: Visually significant age-related combined cataract, Left Eye (H25.812)  Post-operative diagnosis: Visually significant age-related combined cataract, Left Eye (H25.812)  Procedure: Removal of cataract via phacoemulsification and insertion of intra-ocular lens [employer] [id number]  +21.0D into the capsular bag of the Left Eye  Attending surgeon: Gerda Diss. Jenyfer Trawick, MD, MA  Anesthesia: MAC, Topical [place]  Complications: None  Estimated Blood Loss: <7m (minimal)  Specimens: None  Implants: As above  Indications:  Visually significant age-related cataract, Left Eye  Procedure:  The patient was seen and identified in the pre-operative area. The operative eye was identified and dilated.  The operative eye was marked.  Topical anesthesia was administered to the operative eye.     The patient was then to the operative suite and placed in the supine position.  A timeout was performed confirming the patient, procedure to be performed, and all other relevant information.   The patient's face was prepped and draped in the usual fashion for intra-ocular surgery.  A lid speculum was placed into the operative eye and the surgical microscope moved into place and focused.  An inferotemporal paracentesis was created using a 20 gauge paracentesis blade.  Shugarcaine was injected into the anterior chamber.  Viscoelastic was injected into the anterior chamber.  A temporal clear-corneal main wound incision was created using a 2.467mmicrokeratome.  A continuous curvilinear capsulorrhexis was initiated using an irrigating cystitome and completed using capsulorrhexis forceps.  Hydrodissection and hydrodeliniation were performed.  Viscoelastic was injected into the anterior chamber.  A phacoemulsification handpiece and a chopper as a second instrument were used to remove the nucleus and epinucleus. The irrigation/aspiration handpiece was used to remove  any remaining cortical material.   The capsular bag was reinflated with viscoelastic, checked, and found to be intact.  The intraocular lens was inserted into the capsular bag.  The irrigation/aspiration handpiece was used to remove any remaining viscoelastic.  The clear corneal wound and paracentesis wounds were then hydrated and checked with Weck-Cels to be watertight.  The lid-speculum was removed.  The drape was removed.  The patient's face was cleaned with a wet and dry 4x4.   Maxitrol was instilled in the eye. A clear shield was taped over the eye. The patient was taken to the post-operative care unit in good condition, having tolerated the procedure well.  Post-Op Instructions: [facility] and will receive all other orders and instructions.

## 2020-08-21 NOTE — Anesthesia Preprocedure Evaluation (Signed)
Anesthesia Evaluation  Patient identified by MRN, date of birth, ID band Patient awake    Reviewed: Allergy & Precautions, NPO status , Patient's Chart, lab work & pertinent test results  History of Anesthesia Complications Negative for: history of anesthetic complications  Airway Mallampati: III  TM Distance: >3 FB Neck ROM: Full    Dental  (+) Edentulous Upper, Edentulous Lower   Pulmonary Current SmokerPatient did not abstain from smoking.,  Room air saturation- 92%  Room air saturation 92% Pulmonary exam normal breath sounds clear to auscultation       Cardiovascular Exercise Tolerance: Good hypertension, Pt. 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)  Normal cardiovascular exam Rhythm:Regular Rate:Normal - Systolic murmurs, - Diastolic murmurs, - Friction Rub, - Carotid Bruit, - Peripheral Edema and - Systolic Click    Neuro/Psych Seizures - (last seizure - 04/2020, took keppra this morning), Well Controlled,  PSYCHIATRIC DISORDERS    GI/Hepatic negative GI ROS, (+)     substance abuse (alcohol abuse - 4 to 6 drinks/day)  alcohol use,   Endo/Other  Hypothyroidism   Renal/GU negative Renal ROS  negative genitourinary   Musculoskeletal negative musculoskeletal ROS (+)   Abdominal   Peds negative pediatric ROS (+)  Hematology negative hematology ROS (+)   Anesthesia Other Findings   Reproductive/Obstetrics negative OB ROS                             Anesthesia Physical  Anesthesia Plan  ASA: III  Anesthesia Plan: MAC   Post-op Pain Management:    Induction:   PONV Risk Score and Plan:   Airway Management Planned: Nasal Cannula and Natural Airway  Additional Equipment:   Intra-op Plan:   Post-operative Plan:   Informed Consent: I have reviewed the patients History and Physical, chart, labs and discussed the  procedure including the risks, benefits and alternatives for the proposed anesthesia with the patient or authorized representative who has indicated his/her understanding and acceptance.     Dental advisory given  Plan Discussed with: CRNA and Surgeon  Anesthesia Plan Comments:         Anesthesia Quick Evaluation

## 2020-08-21 NOTE — Anesthesia Postprocedure Evaluation (Signed)
Anesthesia Post Note  Patient: Andrew Knapp  Procedure(s) Performed: CATARACT EXTRACTION PHACO AND INTRAOCULAR LENS PLACEMENT ([place]) (Left Eye)  Patient location during evaluation: Phase II Anesthesia Type: MAC Level of consciousness: awake and alert Pain management: pain level controlled Vital Signs Assessment: post-procedure vital signs reviewed and stable Respiratory status: spontaneous breathing, nonlabored ventilation and respiratory function stable Cardiovascular status: stable and blood pressure returned to baseline Postop Assessment: no apparent nausea or vomiting Anesthetic complications: no   No complications documented.   Last Vitals:  Vitals:   08/21/20 1127  BP: (!) 144/88  Pulse: 63  Resp: 18  Temp: 36.8 C  SpO2: 98%    Last Pain:  Vitals:   08/21/20 1127  TempSrc: Oral  PainSc: [place]

## 2020-08-21 NOTE — Progress Notes (Signed)
States legs go to shaking and he hits the ground .  Dont use any equipment stated he just holds on to stuff.

## 2020-08-22 ENCOUNTER — Encounter (HOSPITAL_COMMUNITY): Payer: Self-pay | Admitting: Ophthalmology

## 2020-11-02 NOTE — Progress Notes (Signed)
Referring Provider: Asencion Noble, MD Primary Care Physician:  Asencion Noble, MD Primary Gastroenterologist:  Dr. Abbey Chatters  Chief Complaint  Patient presents with  . Rectal Bleeding    never had tcs, mother has cancer    HPI:   Andrew Knapp is a 59 y.o. male presenting today at the request of Asencion Noble, MD for rectal bleeding. History significant for alcohol abuse, seizure disorder, carotid artery occlusion, HTN, anxiety, depression, thrombocytopenia, and elevated AST and total bilirubin.  Patient saw Dr. Willey Blade on 10/31/2020 and reported new onset of rectal bleeding x1 day. Denied abdominal pain or rectal pain.  Rectal exam with no visible external hemorrhoid, no palpable mass, prostate probably normal, bright red blood present on exam glove.  CBC with hemoglobin of 14.7 platelets 128,000.   Today:  Rectal bleeding started Monday.  Has never had rectal bleeding in the past. Passing blood with BMs and without BMs at times. Blood is bright red. Turns the water red. No black stools. Had some cramping in his sides this morning when he woke up but states this is related to how he was lying in bed.  Once he got up, pain resolved.  No abdominal pain currently.  He has not had any abdominal pain prior to this morning.  Denies constipation, no reports of rectal pain, burning, or itching.  Typically with a couple of soft formed BMs daily.  This morning, he has had 3 bowel movements that are soft and formed. No diarrhea. Had mild nausea this morning, but this has resolved. No vomiting. No GERD symptoms. No dysphagia. No NSAIDs other than baby aspirin, but PCP stopped this.  No blood thinners.  No prior colonoscopy. Mother has stage 4 mouth cancer.   Denies lightheadedness, presyncope, syncope, chest pain, heart palpitations, shortness of breath, or blurry vision.  He does report having pressure above his eyes like a sinus headache, runny nose, and mild cough that started last night.  He has been vaccinated  against flu.  He does not have a fever.   No seizures or spells of passing out/loss of consciousness since May. Taking Keppra.   Abnormal LFTs: Persistent elevation of AST and total bilirubin since June 2019 which are the first labs in our system. Reviewed recent evaluation during hospitalization in May 2021 with hepatitis C antibody and hepatitis B surface antigen negative.  Ultrasound with hepatic steatosis.  CT with severe hepatic steatosis with question of low density just above gallbladder fossa.  MRI with and without contrast with decreased sensitivity due to severe motion artifact but states a small area of concern in the gallbladder fossa was not seen and may have been an incidental finding on prior CT.  Had diffuse hepatic steatosis and possibly superimposed hemochromatosis as well as low T1 signal in the spleen and spleen suggest a component of hemosiderosis.  No iron panel on file.  Platelets are also chronically low likely secondary to chronic alcohol abuse and myelosuppression.  Dad passed from alcoholic cirrhosis. No swelling in abdomen or LE. No jaundice. No confusion. Bruises easily. On average, he will drink 2-3 shots of liquor a day. Maybe 1/5 and 1 pint of liquor weekly. Has back pain. Not NSAIDs. No tylenol. PCP stopped baby aspirin. No history of IV or intranasal drug use. Has tattoo received professionally.    Reviewed additional admission information from May 2021.  Patient presented due to syncopal episodes suspected to be secondary to seizures.  Neurology was consulted and patient was started on  Keppra.  He was also experiencing nausea and vomiting and abdominal pain suspected to be secondary to alcoholic gastritis/esophagitis and was treated with PPI twice daily although it does not appear he was discharged on this.  Also found to have right ICA stenosis with recommendations to follow-up with vascular surgery.  He has had follow-up with vascular surgery with plans to follow-up in  1 year for repeat carotid duplex exam.  Past Medical History:  Diagnosis Date  . Alcoholism ([place])   . Anxiety   . Carotid artery occlusion   . Depression   . Hypertension   . Seizure disorder ([place])   . Thrombocytopenia ([place])     Past Surgical History:  Procedure Laterality Date  . CATARACT EXTRACTION W/PHACO Right 07/28/2020   Procedure: CATARACT EXTRACTION PHACO AND INTRAOCULAR LENS PLACEMENT RIGHT EYE;  Surgeon: Baruch Goldmann, MD;  Location: [facility];  Service: Ophthalmology;  Laterality: Right;  CDE: 11.13  . CATARACT EXTRACTION W/PHACO Left 08/21/2020   Procedure: CATARACT EXTRACTION PHACO AND INTRAOCULAR LENS PLACEMENT (IOC);  Surgeon: Baruch Goldmann, MD;  Location: [facility];  Service: Ophthalmology;  Laterality: Left;  CDE: 8.31  . FOOT FRACTURE SURGERY    . HAND SURGERY      Current Outpatient Medications  Medication Sig Dispense Refill  . ALPRAZolam (XANAX) 1 MG tablet Take 1 mg by mouth 3 (three) times daily as needed for sleep.     [name] amLODipine (NORVASC) 10 MG tablet Take 1 tablet (10 mg total) by mouth daily. (Patient taking differently: Take 10 mg by mouth daily. ) 30 tablet 0  . atorvastatin (LIPITOR) 40 MG tablet Take 40 mg by mouth at bedtime.    . levETIRAcetam (KEPPRA) 500 MG tablet Take 1 tablet (500 mg total) by mouth 2 (two) times daily. 60 tablet 1  . lisinopril (PRINIVIL,ZESTRIL) 20 MG tablet Take 1 tablet (20 mg total) by mouth daily. 30 tablet 0  . PARoxetine (PAXIL) 30 MG tablet Take 1 tablet (30 mg total) by mouth daily. 30 tablet 0  . aspirin 81 MG chewable tablet Chew 1 tablet (81 mg total) by mouth daily. (Patient not taking: Reported on 11/03/2020)     No current facility-administered medications for this visit.    Allergies as of 11/03/2020  . (No Known Allergies)    Family History  Problem Relation Age of Onset  . Breast cancer Mother   . Cancer Mother        mouth   . Colon cancer Neg Hx     Social History   Socioeconomic History  .  Marital status: Legally Separated    Spouse name: Not on file  . Number of children: Not on file  . Years of education: Not on file  . Highest education level: Not on file  Occupational History  . Not on file  Tobacco Use  . Smoking status: Current Every Day Smoker    Packs/day: 0.50    Types: Cigarettes  . Smokeless tobacco: Never Used  Vaping Use  . Vaping Use: Never used  Substance and Sexual Activity  . Alcohol use: Yes    Alcohol/week: 2.0 standard drinks    Types: 2 Shots of liquor per week    Comment: 1/5 of liquor weekly or more.   . Drug use: Not Currently  . Sexual activity: Yes  Other Topics Concern  . Not on file  Social History Narrative  . Not on file   Social Determinants of Health   Financial Resource Strain:   .  Difficulty of Paying Living Expenses: Not on file  Food Insecurity:   . Worried About Charity fundraiser in the Last Year: Not on file  . Ran Out of Food in the Last Year: Not on file  Transportation Needs:   . Lack of Transportation (Medical): Not on file  . Lack of Transportation (Non-Medical): Not on file  Physical Activity:   . Days of Exercise per Week: Not on file  . Minutes of Exercise per Session: Not on file  Stress:   . Feeling of Stress : Not on file  Social Connections:   . Frequency of Communication with Friends and Family: Not on file  . Frequency of Social Gatherings with Friends and Family: Not on file  . Attends Religious Services: Not on file  . Active Member of Clubs or Organizations: Not on file  . Attends Archivist Meetings: Not on file  . Marital Status: Not on file  Intimate Partner Violence:   . Fear of Current or Ex-Partner: Not on file  . Emotionally Abused: Not on file  . Physically Abused: Not on file  . Sexually Abused: Not on file    Review of Systems: Gen: See HPI CV: See HPI Resp: See HPI.  GI: See HPI GU : Denies urinary burning, urinary frequency, urinary hesitancy MS: Chronic back  pain.  Derm: Denies rash Psych: Admits to history of depression/anxiety that is well controlled.  Heme: Bruises easily.   Physical Exam: BP (!) 160/90   Pulse 90   Temp (!) 96.6 F (35.9 C) (Temporal)   Ht 6\' 1"  (1.854 m)   Wt 213 lb 3.2 oz (96.7 kg)   BMI 28.13 kg/m  General:   Alert and oriented. Pleasant and cooperative. Well-nourished and well-developed. Appears older than stated age.  Head:  Normocephalic and atraumatic. Eyes:  Without icterus, sclera clear and conjunctiva pink.  Ears:  Normal auditory acuity. Lungs:  Clear to auscultation bilaterally. No wheezes, rales, or rhonchi. No distress.  Heart:  S1, S2 present without murmurs appreciated.  Abdomen:  +BS, soft, non-tender and non-distended. I believe I was able to palpate the liver borer just below the costal margin. No splenomegaly.  No guarding or rebound. No masses appreciated.  Rectal:  Deferred  Msk:  Symmetrical without gross deformities. Normal posture. Extremities:  Without edema. Neurologic:  Alert and  oriented x4;  grossly normal neurologically. Skin:  Intact without significant lesions or rashes. Psych: Normal mood and affect.

## 2020-11-03 ENCOUNTER — Encounter: Payer: Self-pay | Admitting: Gastroenterology

## 2020-11-03 ENCOUNTER — Other Ambulatory Visit: Payer: Self-pay

## 2020-11-03 ENCOUNTER — Ambulatory Visit (INDEPENDENT_AMBULATORY_CARE_PROVIDER_SITE_OTHER): Payer: Medicaid Other | Admitting: Gastroenterology

## 2020-11-03 VITALS — BP 160/90 | HR 90 | Temp 96.6°F | Ht 73.0 in | Wt 213.2 lb

## 2020-11-03 DIAGNOSIS — K625 Hemorrhage of anus and rectum: Secondary | ICD-10-CM | POA: Diagnosis not present

## 2020-11-03 DIAGNOSIS — R7989 Other specified abnormal findings of blood chemistry: Secondary | ICD-10-CM | POA: Diagnosis not present

## 2020-11-03 DIAGNOSIS — R0981 Nasal congestion: Secondary | ICD-10-CM

## 2020-11-03 DIAGNOSIS — I10 Essential (primary) hypertension: Secondary | ICD-10-CM

## 2020-11-03 DIAGNOSIS — R932 Abnormal findings on diagnostic imaging of liver and biliary tract: Secondary | ICD-10-CM | POA: Diagnosis not present

## 2020-11-03 DIAGNOSIS — K760 Fatty (change of) liver, not elsewhere classified: Secondary | ICD-10-CM | POA: Diagnosis not present

## 2020-11-03 NOTE — Assessment & Plan Note (Signed)
x1 day. No fever or shortness of breath. Advised he monitor and follow-up with PCP if symptoms persist.

## 2020-11-03 NOTE — Assessment & Plan Note (Addendum)
BP quite elevated at 193/102 today.  Recheck prior to leaving her office with blood pressure down to 160/90. Patient did take his BP medications this morning but forgot to take them yesterday. Advised patient to ensure he is taking his blood pressure medications every day and to call Dr. Willey Blade to follow-up on this ASAP as he likely needs an increase in his blood pressure medications.

## 2020-11-03 NOTE — Patient Instructions (Signed)
PA for TCS submitted via [facility] website. PA# [id number], valid 12/26/20-03/26/21.

## 2020-11-03 NOTE — Assessment & Plan Note (Signed)
59 y.o. male with new onset rectal bleeding x5 days. Reports bright red blood per rectum occurring with bowel movements occasionally without bowel movements.  No melena. Had a cramp in his sides this morning when he woke up but this has resolved and states it was secondary to how he was laying in the bed. No other abdominal pain. Denies constipation or known hemorrhoids. No prior colonoscopy. Declined rectal exam today. Recently saw PCP on 11/2. Rectal exam completed at that time with no visible external hemorrhoids, no palpable mass, bright red blood present on exam glove. He had been on baby aspirin, but PCP discontinued this. Labs ordered and hemoglobin was 14.7. Platelets low at 128 which is a chronic findings and likely secondary to chronic alcohol use.   Could have bleeding in the setting of internal hemorrhoids. Additional differentials include diverticular bleed, AMVs, large colon polyps, and malignancy.   Plan:  Update CBC  Proceed with colonoscopy with propofol with Dr. Abbey Chatters in the near future. The risks, benefits, and alternatives have been discussed with the patient in detail. The patient states understanding and desires to proceed.  ASA III Advised if he has any profuse rectal bleeding, feels lightheaded, dizzy, like he may pass out, he should proceed to the emergency room. Follow-up after procedure.

## 2020-11-03 NOTE — Assessment & Plan Note (Signed)
59 year old male with persistent elevation of AST and total bilirubin at least since June 2019 along with persistent thrombocytopenia.  Notably, these are the first labs in our system.  This is in the setting of chronic alcohol abuse.  Recent evaluation during hospitalization in May 2021 with hep C antibody 0.1 (negative), hepatitis B surface antigen negative. Ultrasound with hepatic steatosis.  CT with severe hepatic steatosis with question of low density just above gallbladder fossa.  MRI with and without contrast with decreased sensitivity due to severe motion artifact but states a small area of concern in the gallbladder fossa was not seen and may have been an incidental finding on prior CT.  Had diffuse hepatic steatosis and possibly superimposed hemochromatosis as well as low T1 signal in the spleen and spleen suggest a component of hemosiderosis.  No iron panel on file.   He has no signs or symptoms of decompensated liver disease other than some easy bruising. Continues drinking at least 1/5th of liquor weekly. Denies history of IV or intranasal drug use.  Has 1 tattoo received professionally.  Father had alcoholic cirrhosis.  No other family history of liver disease.  Suspect LFT elevation and thrombocytopenia are secondary to chronic alcohol abuse. No mention of cirrhosis on imaging. Could have a component of Gilberts syndrome contributing to mildly elevated bilirubin. Will need to obtain labs to evaluate for hemochromatosis.   Plan:  HFP to fractionate bilirubin and update LFTs Iron panel with ferritin.  [place] Elastography Liver to evaluate for degree of fibrosis in the setting of chronic alcohol abuse.  Hep B core Ab and Hep B surface Ab.  Repeat Hep C ab to ensure the result was accurate as Ab was detected but low enough to be considered negative.  Counseled on importance of working on limiting alcohol use and working towards cessation.  Low fat/cholesterol diet.   Avoid sweets, sodas, fruit  juices, sweetened beverages like tea, etc. Gradually increase exercise from 15 min daily up to 1 hr per day 5 days/week. Further recommendations to follow.

## 2020-11-03 NOTE — Assessment & Plan Note (Signed)
Addressed under elevated LFTs. 

## 2020-11-03 NOTE — Patient Instructions (Addendum)
We will get you scheduled for a colonoscopy in the near future with Dr. Abbey Chatters. Please remember to take your blood pressure medications the morning of your procedure.  Please have labs completed.  I am also ordering an elastography of your liver to evaluate for any underlying stiffening of your liver.  Please work on decreasing alcohol use and work towards abstinence.  Instructions for fatty liver: Low fat/cholesterol diet.   Avoid sweets, sodas, fruit juices, sweetened beverages like tea, etc. Gradually increase exercise from 15 min daily up to 1 hr per day 5 days/week.  Please be sure to take your blood pressure medications every day as your blood pressure was quite elevated today.  Upon recheck, it did come down.  I recommend you follow-up with Dr. Willey Blade on this ASAP as you may need an increase in your blood pressure medications.  Please also follow-up with Dr. Willey Blade on your sinus symptoms if they persist.  We will see you back after your procedure.  If you have any profuse rectal bleeding, feel lightheaded, dizzy, like he may pass out, you should proceed to the emergency room.  Andrew Altes, PA-C [facility]    Fatty Liver Disease  Fatty liver disease occurs when too much fat has built up in your liver cells. Fatty liver disease is also called hepatic steatosis or steatohepatitis. The liver removes harmful substances from your bloodstream and produces fluids that your body needs. It also helps your body use and store energy from the food you eat. In many cases, fatty liver disease does not cause symptoms or problems. It is often diagnosed when tests are being done for other reasons. However, over time, fatty liver can cause inflammation that may lead to more serious liver problems, such as scarring of the liver (cirrhosis) and liver failure. Fatty liver is associated with insulin resistance, increased body fat, high blood pressure (hypertension), and high  cholesterol. These are features of metabolic syndrome and increase your risk for stroke, diabetes, and heart disease. What are the causes? This condition may be caused by:  Drinking too much alcohol.  Poor nutrition.  Obesity.  Cushing's syndrome.  Diabetes.  High cholesterol.  Certain drugs.  Poisons.  Some viral infections.  Pregnancy. What increases the risk? You are more likely to develop this condition if you:  Abuse alcohol.  Are overweight.  Have diabetes.  Have hepatitis.  Have a high triglyceride level.  Are pregnant. What are the signs or symptoms? Fatty liver disease often does not cause symptoms. If symptoms do develop, they can include:  Fatigue.  Weakness.  Weight loss.  Confusion.  Abdominal pain.  Nausea and vomiting.  Yellowing of your skin and the white parts of your eyes (jaundice).  Itchy skin. How is this diagnosed? This condition may be diagnosed by:  A physical exam and medical history.  Blood tests.  Imaging tests, such as an ultrasound, CT scan, or MRI.  A liver biopsy. A small sample of liver tissue is removed using a needle. The sample is then looked at under a microscope. How is this treated? Fatty liver disease is often caused by other health conditions. Treatment for fatty liver may involve medicines and lifestyle changes to manage conditions such as:  Alcoholism.  High cholesterol.  Diabetes.  Being overweight or obese. Follow these instructions at home:   Do not drink alcohol. If you have trouble quitting, ask your health care provider how to safely quit with the help of medicine or a supervised  program. This is important to keep your condition from getting worse.  Eat a healthy diet as told by your health care provider. Ask your health care provider about working with a diet and nutrition specialist (dietitian) to develop an eating plan.  Exercise regularly. This can help you lose weight and control  your cholesterol and diabetes. Talk to your health care provider about an exercise plan and which activities are best for you.  Take over-the-counter and prescription medicines only as told by your health care provider.  Keep all follow-up visits as told by your health care provider. This is important. Contact a health care provider if: You have trouble controlling your:  Blood sugar. This is especially important if you have diabetes.  Cholesterol.  Drinking of alcohol. Get help right away if:  You have abdominal pain.  You have jaundice.  You have nausea and vomiting.  You vomit blood or material that looks like coffee grounds.  You have stools that are black, tar-like, or bloody. Summary  Fatty liver disease develops when too much fat builds up in the cells of your liver.  Fatty liver disease often causes no symptoms or problems. However, over time, fatty liver can cause inflammation that may lead to more serious liver problems, such as scarring of the liver (cirrhosis).  You are more likely to develop this condition if you abuse alcohol, are pregnant, are overweight, have diabetes, have hepatitis, or have high triglyceride levels.  Contact your health care provider if you have trouble controlling your weight, blood sugar, cholesterol, or drinking of alcohol. This information is not intended to replace advice given to you by your health care provider. Make sure you discuss any questions you have with your health care provider. Document Revised: 11/28/2017 Document Reviewed: 09/24/2017 Elsevier Patient Education  2020 [employer].

## 2020-11-07 LAB — HEPATIC FUNCTION PANEL
ALT: 27 IU/L (ref 0–44)
AST: 40 IU/L (ref 0–40)
Albumin: 4.2 g/dL (ref 3.8–4.9)
Alkaline Phosphatase: 97 IU/L (ref 44–121)
Bilirubin Total: 0.4 mg/dL (ref 0.0–1.2)
Bilirubin, Direct: 0.16 mg/dL (ref 0.00–0.40)
Total Protein: 7.2 g/dL (ref 6.0–8.5)

## 2020-11-07 LAB — HEPATITIS B SURFACE ANTIBODY,QUALITATIVE: Hep B Surface Ab, Qual: NONREACTIVE

## 2020-11-07 LAB — CBC WITH DIFFERENTIAL/PLATELET
Basophils Absolute: 0.1 10*3/uL (ref 0.0–0.2)
Basos: 1 %
EOS (ABSOLUTE): 0.8 10*3/uL — ABNORMAL HIGH (ref 0.0–0.4)
Eos: 9 %
Hematocrit: 41 % (ref 37.5–51.0)
Hemoglobin: 14.4 g/dL (ref 13.0–17.7)
Immature Grans (Abs): 0.1 10*3/uL (ref 0.0–0.1)
Immature Granulocytes: 1 %
Lymphocytes Absolute: 2.9 10*3/uL (ref 0.7–3.1)
Lymphs: 33 %
MCH: 36.6 pg — ABNORMAL HIGH (ref 26.6–33.0)
MCHC: 35.1 g/dL (ref 31.5–35.7)
MCV: 104 fL — ABNORMAL HIGH (ref 79–97)
Monocytes Absolute: 0.7 10*3/uL (ref 0.1–0.9)
Monocytes: 8 %
Neutrophils Absolute: 4.4 10*3/uL (ref 1.4–7.0)
Neutrophils: 48 %
Platelets: 135 10*3/uL — ABNORMAL LOW (ref 150–450)
RBC: 3.93 x10E6/uL — ABNORMAL LOW (ref 4.14–5.80)
RDW: 15.6 % — ABNORMAL HIGH (ref 11.6–15.4)
WBC: 8.9 10*3/uL (ref 3.4–10.8)

## 2020-11-07 LAB — HEPATITIS C ANTIBODY: Hep C Virus Ab: <0.1 s/co ratio (ref 0.0–0.9)

## 2020-11-07 LAB — HEPATITIS B CORE ANTIBODY, TOTAL: Hep B Core Total Ab: NEGATIVE

## 2020-11-07 LAB — IRON,TIBC AND FERRITIN PANEL
Ferritin: 1399 ng/mL — ABNORMAL HIGH (ref 30–400)
Iron Saturation: 29 % (ref 15–55)
Iron: 83 ug/dL (ref 38–169)
Total Iron Binding Capacity: 288 ug/dL (ref 250–450)
UIBC: 205 ug/dL (ref 111–343)

## 2020-11-08 NOTE — Progress Notes (Signed)
CC'ED TO PCP 

## 2020-11-09 ENCOUNTER — Other Ambulatory Visit: Payer: Self-pay

## 2020-11-09 ENCOUNTER — Ambulatory Visit (HOSPITAL_COMMUNITY)
Admission: RE | Admit: 2020-11-09 | Discharge: 2020-11-09 | Disposition: A | Discharge status: Home / Self Care | Payer: Medicaid Other | Source: Ambulatory Visit | Attending: Gastroenterology | Admitting: Gastroenterology

## 2020-11-09 DIAGNOSIS — R7989 Other specified abnormal findings of blood chemistry: Secondary | ICD-10-CM

## 2020-11-09 DIAGNOSIS — R932 Abnormal findings on diagnostic imaging of liver and biliary tract: Secondary | ICD-10-CM | POA: Insufficient documentation

## 2020-11-09 DIAGNOSIS — K760 Fatty (change of) liver, not elsewhere classified: Secondary | ICD-10-CM | POA: Diagnosis present

## 2020-12-20 NOTE — Patient Instructions (Signed)
Your procedure is scheduled on: 12/26/2020  Report to [facility] at     12:45 PM.  Call this number if you have problems the morning of surgery: [phone]   Remember:              Follow Directions on the letter you received from Your Physician's office regarding the Bowel Prep              No Smoking the day of Procedure :   Take these medicines the morning of surgery with A SIP OF WATER: Amlodipine, Keppra, Paxil and xanax if needed   Do not wear jewelry, make-up or nail polish.    Do not bring valuables to the hospital.  Contacts, dentures or bridgework may not be worn into surgery.  .   Patients discharged the day of surgery will not be allowed to drive home.     Colonoscopy, Adult, Care After This sheet gives you information about how to care for yourself after your procedure. Your health care provider may also give you more specific instructions. If you have problems or questions, contact your health care provider. What can I expect after the procedure? After the procedure, it is common to have:  A small amount of blood in your stool for 24 hours after the procedure.  Some gas.  Mild abdominal cramping or bloating.  Follow these instructions at home: General instructions   For the first 24 hours after the procedure: ? Do not drive or use machinery. ? Do not sign important documents. ? Do not drink alcohol. ? Do your regular daily activities at a slower pace than normal. ? Eat soft, easy-to-digest foods. ? Rest often.  Take over-the-counter or prescription medicines only as told by your health care provider.  It is up to you to get the results of your procedure. Ask your health care provider, or the department performing the procedure, when your results will be ready. Relieving cramping and bloating  Try walking around when you have cramps or feel bloated.  Apply heat to your abdomen as told by your health care provider. Use a heat source that your health  care provider recommends, such as a moist heat pack or a heating pad. ? Place a towel between your skin and the heat source. ? Leave the heat on for 20-30 minutes. ? Remove the heat if your skin turns bright red. This is especially important if you are unable to feel pain, heat, or cold. You may have a greater risk of getting burned. Eating and drinking  Drink enough fluid to keep your urine clear or pale yellow.  Resume your normal diet as instructed by your health care provider. Avoid heavy or fried foods that are hard to digest.  Avoid drinking alcohol for as long as instructed by your health care provider. Contact a health care provider if:  You have blood in your stool 2-3 days after the procedure. Get help right away if:  You have more than a small spotting of blood in your stool.  You pass large blood clots in your stool.  Your abdomen is swollen.  You have nausea or vomiting.  You have a fever.  You have increasing abdominal pain that is not relieved with medicine. This information is not intended to replace advice given to you by your health care provider. Make sure you discuss any questions you have with your health care provider. Document Released: 07/30/2004 Document Revised: 09/09/2016 Document Reviewed: 02/27/2016 Elsevier Interactive  Patient Education  [employer].

## 2020-12-21 ENCOUNTER — Encounter (HOSPITAL_COMMUNITY): Payer: Self-pay

## 2020-12-21 ENCOUNTER — Other Ambulatory Visit (HOSPITAL_COMMUNITY)
Admission: RE | Admit: 2020-12-21 | Discharge: 2020-12-21 | Disposition: A | Discharge status: Home / Self Care | Payer: Medicaid Other | Source: Ambulatory Visit | Attending: Internal Medicine | Admitting: Internal Medicine

## 2020-12-21 ENCOUNTER — Encounter (HOSPITAL_COMMUNITY)
Admission: RE | Admit: 2020-12-21 | Discharge: 2020-12-21 | Disposition: A | Discharge status: Home / Self Care | Payer: Medicaid Other | Source: Ambulatory Visit | Attending: Internal Medicine | Admitting: Internal Medicine

## 2020-12-21 ENCOUNTER — Other Ambulatory Visit: Payer: Self-pay

## 2020-12-21 DIAGNOSIS — Z01812 Encounter for preprocedural laboratory examination: Secondary | ICD-10-CM | POA: Insufficient documentation

## 2020-12-21 DIAGNOSIS — Z20822 Contact with and (suspected) exposure to covid-19: Secondary | ICD-10-CM | POA: Insufficient documentation

## 2020-12-21 LAB — SARS CORONAVIRUS 2 (TAT 6-24 HRS): SARS Coronavirus 2: NEGATIVE

## 2020-12-26 ENCOUNTER — Ambulatory Visit (HOSPITAL_COMMUNITY): Payer: Medicaid Other | Admitting: Anesthesiology

## 2020-12-26 ENCOUNTER — Encounter (HOSPITAL_COMMUNITY)
Admission: RE | Disposition: A | Discharge status: Home / Self Care | Payer: Self-pay | Source: Home / Self Care | Attending: Internal Medicine

## 2020-12-26 ENCOUNTER — Encounter (HOSPITAL_COMMUNITY): Payer: Self-pay

## 2020-12-26 ENCOUNTER — Ambulatory Visit (HOSPITAL_COMMUNITY)
Admission: RE | Admit: 2020-12-26 | Discharge: 2020-12-26 | Disposition: A | Discharge status: Home / Self Care | Payer: Medicaid Other | Attending: Internal Medicine | Admitting: Internal Medicine

## 2020-12-26 ENCOUNTER — Other Ambulatory Visit: Payer: Self-pay

## 2020-12-26 DIAGNOSIS — D123 Benign neoplasm of transverse colon: Secondary | ICD-10-CM | POA: Insufficient documentation

## 2020-12-26 DIAGNOSIS — D122 Benign neoplasm of ascending colon: Secondary | ICD-10-CM | POA: Diagnosis not present

## 2020-12-26 DIAGNOSIS — K625 Hemorrhage of anus and rectum: Secondary | ICD-10-CM | POA: Diagnosis present

## 2020-12-26 DIAGNOSIS — D127 Benign neoplasm of rectosigmoid junction: Secondary | ICD-10-CM | POA: Insufficient documentation

## 2020-12-26 DIAGNOSIS — Z79899 Other long term (current) drug therapy: Secondary | ICD-10-CM | POA: Insufficient documentation

## 2020-12-26 DIAGNOSIS — D125 Benign neoplasm of sigmoid colon: Secondary | ICD-10-CM | POA: Insufficient documentation

## 2020-12-26 DIAGNOSIS — K648 Other hemorrhoids: Secondary | ICD-10-CM | POA: Diagnosis not present

## 2020-12-26 DIAGNOSIS — F17210 Nicotine dependence, cigarettes, uncomplicated: Secondary | ICD-10-CM | POA: Insufficient documentation

## 2020-12-26 HISTORY — PX: POLYPECTOMY: SHX5525

## 2020-12-26 HISTORY — PX: BIOPSY: SHX5522

## 2020-12-26 HISTORY — PX: COLONOSCOPY WITH PROPOFOL: SHX5780

## 2020-12-26 SURGERY — COLONOSCOPY WITH PROPOFOL
Anesthesia: General

## 2020-12-26 MED ORDER — PROPOFOL 10 MG/ML IV BOLUS
INTRAVENOUS | Status: AC
  Filled 2020-12-26: qty 20

## 2020-12-26 MED ORDER — PROPOFOL 10 MG/ML IV BOLUS
INTRAVENOUS | Status: DC | PRN
  Administered 2020-12-26: 50 mg via INTRAVENOUS
  Administered 2020-12-26: 30 mg via INTRAVENOUS
  Administered 2020-12-26: 70 mg via INTRAVENOUS
  Administered 2020-12-26: 20 mg via INTRAVENOUS

## 2020-12-26 MED ORDER — PHENYLEPHRINE HCL (PRESSORS) 10 MG/ML IV SOLN
INTRAVENOUS | Status: DC | PRN
  Administered 2020-12-26 (×6): 80 ug via INTRAVENOUS

## 2020-12-26 MED ORDER — PHENYLEPHRINE 40 MCG/ML (10ML) SYRINGE FOR IV PUSH (FOR BLOOD PRESSURE SUPPORT)
PREFILLED_SYRINGE | INTRAVENOUS | Status: AC
  Filled 2020-12-26: qty 10

## 2020-12-26 MED ORDER — PHENYLEPHRINE HCL (PRESSORS) 10 MG/ML IV SOLN
INTRAVENOUS | Status: AC
  Filled 2020-12-26: qty 1

## 2020-12-26 MED ORDER — DEXAMETHASONE SODIUM PHOSPHATE 4 MG/ML IJ SOLN
INTRAMUSCULAR | Status: AC
  Filled 2020-12-26: qty 4

## 2020-12-26 MED ORDER — LACTATED RINGERS IV SOLN: INTRAVENOUS | Status: DC

## 2020-12-26 MED ORDER — STERILE WATER FOR IRRIGATION IR SOLN
Status: DC | PRN
  Administered 2020-12-26: 14:00:00 100 mL

## 2020-12-26 MED ORDER — PROPOFOL 500 MG/50ML IV EMUL
INTRAVENOUS | Status: DC | PRN
  Administered 2020-12-26: 100 ug/kg/min via INTRAVENOUS

## 2020-12-26 MED ORDER — PROPOFOL 10 MG/ML IV BOLUS
INTRAVENOUS | Status: AC
  Filled 2020-12-26: qty 120

## 2020-12-26 MED ORDER — LIDOCAINE HCL (PF) 2 % IJ SOLN
INTRAMUSCULAR | Status: AC
  Filled 2020-12-26: qty 10

## 2020-12-26 MED ORDER — PROPOFOL 10 MG/ML IV BOLUS
INTRAVENOUS | Status: AC
  Filled 2020-12-26: qty 100

## 2020-12-26 MED ORDER — SODIUM CHLORIDE FLUSH 0.9 % IV SOLN
INTRAVENOUS | Status: AC
  Filled 2020-12-26: qty 20

## 2020-12-26 NOTE — Anesthesia Postprocedure Evaluation (Signed)
Anesthesia Post Note  Patient: Andrew Knapp  Procedure(s) Performed: COLONOSCOPY WITH PROPOFOL (N/A ) POLYPECTOMY BIOPSY  Patient location during evaluation: PACU Anesthesia Type: General Level of consciousness: awake Pain management: pain level controlled Vital Signs Assessment: post-procedure vital signs reviewed and stable Respiratory status: spontaneous breathing Cardiovascular status: blood pressure returned to baseline Anesthetic complications: no   No complications documented.   Last Vitals:  Vitals:   12/26/20 1333 12/26/20 1426  BP: (!) 150/88   Pulse:  77  Resp:  (!) 22  Temp:    SpO2:  96%    Last Pain:  Vitals:   12/26/20 1344  TempSrc:   PainSc: 0-No pain                 [name]

## 2020-12-26 NOTE — H&P (Signed)
Primary Care Physician:  Asencion Noble, MD Primary Gastroenterologist:  Dr. Abbey Chatters  Pre-Procedure History & Physical: HPI:  Andrew Knapp is a 59 y.o. male is here for a colonoscopy for rectal bleeding. Notes bright red blood per rectum x5 days. Patient denies any family history of colorectal cancer.  No abdominal pain or unintentional weight loss.    Past Medical History:  Diagnosis Date  . Alcoholism ([place])   . Anxiety   . Carotid artery occlusion   . Depression   . Hypertension   . Seizure disorder ([place])   . Thrombocytopenia ([place])     Past Surgical History:  Procedure Laterality Date  . CATARACT EXTRACTION W/PHACO Right 07/28/2020   Procedure: CATARACT EXTRACTION PHACO AND INTRAOCULAR LENS PLACEMENT RIGHT EYE;  Surgeon: Baruch Goldmann, MD;  Location: [facility];  Service: Ophthalmology;  Laterality: Right;  CDE: 11.13  . CATARACT EXTRACTION W/PHACO Left 08/21/2020   Procedure: CATARACT EXTRACTION PHACO AND INTRAOCULAR LENS PLACEMENT (IOC);  Surgeon: Baruch Goldmann, MD;  Location: [facility];  Service: Ophthalmology;  Laterality: Left;  CDE: 8.31  . FOOT FRACTURE SURGERY    . HAND SURGERY      Prior to Admission medications   Medication Sig Start Date End Date Taking? Authorizing Provider  ALPRAZolam Duanne Moron) 1 MG tablet Take 1 mg by mouth 3 (three) times daily as needed for sleep or anxiety. 04/07/20  Yes [provider]  amLODipine (NORVASC) 10 MG tablet Take 10 mg by mouth daily.   Yes [provider]  atorvastatin (LIPITOR) 40 MG tablet Take 40 mg by mouth at bedtime. 08/10/20  Yes [provider]  levETIRAcetam (KEPPRA) 500 MG tablet Take 1 tablet (500 mg total) by mouth 2 (two) times daily. 05/10/20  Yes Tat, Shanon Brow, MD  lisinopril (ZESTRIL) 20 MG tablet Take 20 mg by mouth daily.   Yes [provider]  PARoxetine (PAXIL) 30 MG tablet Take 30 mg by mouth daily.   Yes [provider]    Allergies as of 11/03/2020  . (No Known Allergies)     Family History  Problem Relation Age of Onset  . Breast cancer Mother   . Cancer Mother        mouth   . Colon cancer Neg Hx     Social History   Socioeconomic History  . Marital status: Legally Separated    Spouse name: Not on file  . Number of children: Not on file  . Years of education: Not on file  . Highest education level: Not on file  Occupational History  . Not on file  Tobacco Use  . Smoking status: Current Every Day Smoker    Packs/day: 0.50    Types: Cigarettes  . Smokeless tobacco: Never Used  Vaping Use  . Vaping Use: Never used  Substance and Sexual Activity  . Alcohol use: Yes    Alcohol/week: 2.0 standard drinks    Types: 2 Shots of liquor per week    Comment: 1/5 of liquor weekly or more.   . Drug use: Not Currently  . Sexual activity: Yes  Other Topics Concern  . Not on file  Social History Narrative  . Not on file   Social Determinants of Health   Financial Resource Strain: Not on file  Food Insecurity: Not on file  Transportation Needs: Not on file  Physical Activity: Not on file  Stress: Not on file  Social Connections: Not on file  Intimate Partner Violence: Not on file  Review of Systems: See HPI, otherwise negative ROS  Impression/Plan: Andrew Knapp is here for a colonoscopy to be performed for rectal bleeding.   The risks of the procedure including infection, bleed, or perforation as well as benefits, limitations, alternatives and imponderables have been reviewed with the patient. Questions have been answered. All parties agreeable.

## 2020-12-26 NOTE — Op Note (Signed)
[facility] Patient Name: Andrew Knapp Procedure Date: 12/26/2020 1:28 PM MRN: [id number] Date of Birth: [birth year] Attending MD: Elon Alas. Abbey Chatters DO CSN: [id number] Age: 59 Admit Type: Outpatient Procedure:                Colonoscopy Indications:              Rectal bleeding Providers:                Elon Alas. Abbey Chatters, DO, Harley Cox, Jessica                            Boudreaux, Tammy Vaught, RN, Wynonia Musty Tech,                            Technician, Nelma Rothman, Technician Referring MD:              Medicines:                See the Anesthesia note for documentation of the                            administered medications Complications:            No immediate complications. Estimated Blood Loss:     Estimated blood loss was minimal. Procedure:                Pre-Anesthesia Assessment:                           - The anesthesia plan was to use monitored                            anesthesia care (MAC).                           After obtaining informed consent, the colonoscope                            was passed under direct vision. Throughout the                            procedure, the patient's blood pressure, pulse, and                            oxygen saturations were monitored continuously. The                            [id number]-HQ190L ([id number]) scope was introduced through                            the anus and advanced to the the cecum, identified                            by appendiceal orifice and ileocecal valve. The                            colonoscopy was performed without difficulty. The  patient tolerated the procedure well. The quality                            of the bowel preparation was evaluated using the                            BBPS [facility] Bowel Preparation Scale) with scores                            of: Right Colon = 2 (minor amount of residual                            staining, small fragments of stool  and/or opaque                            liquid, but mucosa seen well), Transverse Colon = 2                            (minor amount of residual staining, small fragments                            of stool and/or opaque liquid, but mucosa seen                            well) and Left Colon = 2 (minor amount of residual                            staining, small fragments of stool and/or opaque                            liquid, but mucosa seen well). The total BBPS score                            equals 6. The quality of the bowel preparation was                            fair. Scope In: 1:49:52 PM Scope Out: 2:20:58 PM Scope Withdrawal Time: 0 hours 23 minutes 52 seconds  Total Procedure Duration: 0 hours 31 minutes 6 seconds  Findings:      The perianal and digital rectal examinations were normal.      Non-bleeding internal hemorrhoids were found during endoscopy.      Two sessile polyps were found in the ascending colon. The polyps were 8       to 10 mm in size. These polyps were removed with a hot snare. Resection       and retrieval were complete.      A 12 mm polyp was found in the ascending colon. The polyp was sessile.       The polyp was removed with a hot snare. Resection and retrieval were       complete.      Two sessile polyps were found in the transverse colon. The polyps were 5       to 7 mm in size. These polyps  were removed with a cold snare. Resection       and retrieval were complete.      A 10 mm polyp was found in the transverse colon. The polyp was sessile.       The polyp was removed with a hot snare. Resection and retrieval were       complete.      A 2 mm polyp was found in the sigmoid colon. The polyp was sessile. The       polyp was removed with a jumbo cold forceps. Resection and retrieval       were complete.      A 5 mm polyp was found in the sigmoid colon. The polyp was sessile. The       polyp was removed with a cold snare. Resection and retrieval  were       complete.      Five polyps were found in the recto-sigmoid colon. The polyps were       diminutive in size. These polyps were removed with a jumbo cold forceps.       Resection and retrieval were complete. Impression:               - Preparation of the colon was fair.                           - Non-bleeding internal hemorrhoids.                           - Two 8 to 10 mm polyps in the ascending colon,                            removed with a hot snare. Resected and retrieved.                           - One 12 mm polyp in the ascending colon, removed                            with a hot snare. Resected and retrieved.                           - Two 5 to 7 mm polyps in the transverse colon,                            removed with a cold snare. Resected and retrieved.                           - One 10 mm polyp in the transverse colon, removed                            with a hot snare. Resected and retrieved.                           - One 2 mm polyp in the sigmoid colon, removed with                            a jumbo cold forceps.  Resected and retrieved.                           - One 5 mm polyp in the sigmoid colon, removed with                            a cold snare. Resected and retrieved.                           - Five diminutive polyps at the recto-sigmoid                            colon, removed with a jumbo cold forceps. Resected                            and retrieved. Moderate Sedation:      Per Anesthesia Care Recommendation:           - Patient has a contact number available for                            emergencies. The signs and symptoms of potential                            delayed complications were discussed with the                            patient. Return to normal activities tomorrow.                            Written discharge instructions were provided to the                            patient.                           - Resume previous  diet.                           - Continue present medications.                           - Await pathology results.                           - Repeat colonoscopy in 1 year for surveillance.                           - Return to GI clinic in 2 months. Consider                            hemorrhoid banding if still having rectal bleeding. Procedure Code(s):        --- Professional ---                           [phone], Colonoscopy, flexible; with removal of  tumor(s), polyp(s), or other lesion(s) by snare                            technique                           [id number], 59, Colonoscopy, flexible; with biopsy,                            single or multiple Diagnosis Code(s):        --- Professional ---                           K64.8, Other hemorrhoids                           K63.5, Polyp of colon                           K62.5, Hemorrhage of anus and rectum CPT copyright 2019 American Medical Association. All rights reserved. The codes documented in this report are preliminary and upon coder review may  be revised to meet current compliance requirements. Elon Alas. Abbey Chatters, DO [place] Abbey Chatters, DO 12/26/2020 2:27:46 PM This report has been signed electronically. Number of Addenda: 0

## 2020-12-26 NOTE — Transfer of Care (Signed)
Immediate Anesthesia Transfer of Care Note  Patient: Andrew Knapp  Procedure(s) Performed: COLONOSCOPY WITH PROPOFOL (N/A ) POLYPECTOMY BIOPSY  Patient Location: PACU  Anesthesia Type:General  Level of Consciousness: sedated  Airway & Oxygen Therapy: Patient Spontanous Breathing  Post-op Assessment: Report given to RN and Post -op Vital signs reviewed and stable  Post vital signs: Reviewed and stable  Last Vitals:  Vitals Value Taken Time  BP    Temp    Pulse 77 12/26/20 1425  Resp 22 12/26/20 1425  SpO2 96 % 12/26/20 1425  Vitals shown include unvalidated device data.  Last Pain:  Vitals:   12/26/20 1344  TempSrc:   PainSc: 0-No pain         Complications: No complications documented.

## 2020-12-26 NOTE — Discharge Instructions (Signed)
  Colonoscopy Discharge Instructions  Read the instructions outlined below and refer to this sheet in the next few weeks. These discharge instructions provide you with general information on caring for yourself after you leave the hospital. Your doctor may also give you specific instructions. While your treatment has been planned according to the most current medical practices available, unavoidable complications occasionally occur.   ACTIVITY  You may resume your regular activity, but move at a slower pace for the next 24 hours.   Take frequent rest periods for the next 24 hours.   Walking will help get rid of the air and reduce the bloated feeling in your belly (abdomen).   No driving for 24 hours (because of the medicine (anesthesia) used during the test).    Do not sign any important legal documents or operate any machinery for 24 hours (because of the anesthesia used during the test).  NUTRITION  Drink plenty of fluids.   You may resume your normal diet as instructed by your doctor.   Begin with a light meal and progress to your normal diet. Heavy or fried foods are harder to digest and may make you feel sick to your stomach (nauseated).   Avoid alcoholic beverages for 24 hours or as instructed.  MEDICATIONS  You may resume your normal medications unless your doctor tells you otherwise.  WHAT YOU CAN EXPECT TODAY  Some feelings of bloating in the abdomen.   Passage of more gas than usual.   Spotting of blood in your stool or on the toilet paper.  IF YOU HAD POLYPS REMOVED DURING THE COLONOSCOPY:  No aspirin products for 7 days or as instructed.   No alcohol for 7 days or as instructed.   Eat a soft diet for the next 24 hours.  FINDING OUT THE RESULTS OF YOUR TEST Not all test results are available during your visit. If your test results are not back during the visit, make an appointment with your caregiver to find out the results. Do not assume everything is normal if  you have not heard from your caregiver or the medical facility. It is important for you to follow up on all of your test results.  SEEK IMMEDIATE MEDICAL ATTENTION IF:  You have more than a spotting of blood in your stool.   Your belly is swollen (abdominal distention).   You are nauseated or vomiting.   You have a temperature over 101.   You have abdominal pain or discomfort that is severe or gets worse throughout the day.   Your colonoscopy revealed 13 polyp(s) which I removed successfully. Await pathology results, my office will contact you. I recommend repeating colonoscopy in 1 year for surveillance purposes.   You also have diverticulosis and internal hemorrhoids. I would recommend increasing fiber in your diet or adding OTC Benefiber/Metamucil. Be sure to drink at least 4 to 6 glasses of water daily. Follow-up with GI in 2-3 months.     I hope you have a great rest of your week!  Hennie Duos. Marletta Lor, D.O. Gastroenterology and Hepatology [facility]

## 2020-12-26 NOTE — Anesthesia Preprocedure Evaluation (Signed)
Anesthesia Evaluation  Patient identified by MRN, date of birth, ID band Patient awake    Reviewed: Allergy & Precautions, H&P , NPO status , Patient's Chart, lab work & pertinent test results, reviewed documented beta blocker date and time   Airway Mallampati: II  TM Distance: >3 FB Neck ROM: full    Dental no notable dental hx.    Pulmonary neg pulmonary ROS, Current Smoker,    Pulmonary exam normal breath sounds clear to auscultation       Cardiovascular Exercise Tolerance: Good hypertension, negative cardio ROS   Rhythm:regular Rate:Normal     Neuro/Psych Seizures -,  PSYCHIATRIC DISORDERS Anxiety Depression    GI/Hepatic negative GI ROS, Neg liver ROS,   Endo/Other  Hypothyroidism   Renal/GU negative Renal ROS  negative genitourinary   Musculoskeletal   Abdominal   Peds  Hematology negative hematology ROS (+)   Anesthesia Other Findings   Reproductive/Obstetrics negative OB ROS                             Anesthesia Physical Anesthesia Plan  ASA: III  Anesthesia Plan: General   Post-op Pain Management:    Induction:   PONV Risk Score and Plan: Propofol infusion  Airway Management Planned:   Additional Equipment:   Intra-op Plan:   Post-operative Plan:   Informed Consent: I have reviewed the patients History and Physical, chart, labs and discussed the procedure including the risks, benefits and alternatives for the proposed anesthesia with the patient or authorized representative who has indicated his/her understanding and acceptance.     Dental Advisory Given  Plan Discussed with: CRNA  Anesthesia Plan Comments:         Anesthesia Quick Evaluation

## 2020-12-28 LAB — SURGICAL PATHOLOGY

## 2020-12-28 NOTE — Progress Notes (Signed)
noted 

## 2021-01-01 ENCOUNTER — Encounter (HOSPITAL_COMMUNITY): Payer: Self-pay | Admitting: Internal Medicine

## 2021-01-30 DEATH — deceased

## 2021-02-26 ENCOUNTER — Ambulatory Visit: Payer: Medicaid Other | Admitting: Gastroenterology
